# Patient Record
Sex: Female | Born: 1970
Health system: Southern US, Community
[De-identification: ages and names within clinical notes are randomized; demographics above are authoritative.]

## PROBLEM LIST (undated history)

## (undated) DIAGNOSIS — I10 Essential (primary) hypertension: Secondary | ICD-10-CM

## (undated) DIAGNOSIS — G43909 Migraine, unspecified, not intractable, without status migrainosus: Secondary | ICD-10-CM

## (undated) DIAGNOSIS — D1802 Hemangioma of intracranial structures: Secondary | ICD-10-CM

## (undated) HISTORY — PX: BREAST CYST ASPIRATION: SHX578

## (undated) HISTORY — PX: CHOLECYSTECTOMY: SHX55

## (undated) HISTORY — PX: FOOT SURGERY: SHX648

---

## 2006-08-06 ENCOUNTER — Ambulatory Visit: Payer: Self-pay

## 2007-07-05 DIAGNOSIS — J309 Allergic rhinitis, unspecified: Secondary | ICD-10-CM | POA: Insufficient documentation

## 2007-08-14 ENCOUNTER — Emergency Department: Payer: Self-pay | Admitting: Emergency Medicine

## 2007-12-09 ENCOUNTER — Ambulatory Visit: Payer: Self-pay | Admitting: Family Medicine

## 2007-12-09 DIAGNOSIS — R5381 Other malaise: Secondary | ICD-10-CM | POA: Insufficient documentation

## 2008-04-23 ENCOUNTER — Ambulatory Visit: Payer: Self-pay | Admitting: Unknown Physician Specialty

## 2008-08-10 ENCOUNTER — Ambulatory Visit: Payer: Self-pay | Admitting: Family Medicine

## 2008-09-14 DIAGNOSIS — G43109 Migraine with aura, not intractable, without status migrainosus: Secondary | ICD-10-CM | POA: Insufficient documentation

## 2008-10-01 ENCOUNTER — Other Ambulatory Visit: Payer: Self-pay | Admitting: Unknown Physician Specialty

## 2008-11-25 ENCOUNTER — Emergency Department: Payer: Self-pay | Admitting: Emergency Medicine

## 2008-11-27 ENCOUNTER — Other Ambulatory Visit: Payer: Self-pay | Admitting: Obstetrics and Gynecology

## 2009-08-21 ENCOUNTER — Ambulatory Visit: Payer: Self-pay | Admitting: Unknown Physician Specialty

## 2009-10-19 ENCOUNTER — Other Ambulatory Visit: Payer: Self-pay | Admitting: Obstetrics and Gynecology

## 2009-11-08 ENCOUNTER — Ambulatory Visit: Payer: Self-pay | Admitting: Obstetrics and Gynecology

## 2010-04-11 ENCOUNTER — Other Ambulatory Visit: Payer: Self-pay | Admitting: General Practice

## 2010-10-17 ENCOUNTER — Other Ambulatory Visit: Payer: Self-pay | Admitting: Obstetrics and Gynecology

## 2010-11-13 ENCOUNTER — Ambulatory Visit: Payer: Self-pay | Admitting: Obstetrics and Gynecology

## 2010-11-28 ENCOUNTER — Ambulatory Visit: Payer: Self-pay | Admitting: Family Medicine

## 2010-12-05 ENCOUNTER — Ambulatory Visit: Payer: Self-pay | Admitting: Podiatry

## 2010-12-08 LAB — PATHOLOGY REPORT

## 2011-12-23 ENCOUNTER — Ambulatory Visit: Payer: Self-pay | Admitting: Obstetrics and Gynecology

## 2012-01-07 ENCOUNTER — Other Ambulatory Visit: Payer: Self-pay | Admitting: Physician Assistant

## 2012-01-07 LAB — COMPREHENSIVE METABOLIC PANEL
Anion Gap: 9 (ref 7–16)
Bilirubin,Total: 0.3 mg/dL (ref 0.2–1.0)
Chloride: 102 mmol/L (ref 98–107)
Co2: 26 mmol/L (ref 21–32)
Creatinine: 0.79 mg/dL (ref 0.60–1.30)
EGFR (African American): >60
EGFR (Non-African Amer.): >60
Glucose: 87 mg/dL (ref 65–99)
Osmolality: 273 (ref 275–301)
Potassium: 3.5 mmol/L (ref 3.5–5.1)
SGOT(AST): 19 U/L (ref 15–37)
SGPT (ALT): 25 U/L (ref 12–78)
Sodium: 137 mmol/L (ref 136–145)

## 2012-01-07 LAB — CBC WITH DIFFERENTIAL/PLATELET
Basophil #: 0.2 10*3/uL — ABNORMAL HIGH (ref 0.0–0.1)
Eosinophil #: 0.3 10*3/uL (ref 0.0–0.7)
Eosinophil %: 2.5 %
HCT: 40.1 % (ref 35.0–47.0)
MCHC: 33.9 g/dL (ref 32.0–36.0)
MCV: 88 fL (ref 80–100)
Monocyte #: 1.1 x10 3/mm — ABNORMAL HIGH (ref 0.2–0.9)
Monocyte %: 8.5 %
Neutrophil #: 6.4 10*3/uL (ref 1.4–6.5)
RBC: 4.55 10*6/uL (ref 3.80–5.20)
RDW: 12.2 % (ref 11.5–14.5)
WBC: 13.5 10*3/uL — ABNORMAL HIGH (ref 3.6–11.0)

## 2012-01-07 LAB — LIPID PANEL
HDL Cholesterol: 45 mg/dL (ref 40–60)
Ldl Cholesterol, Calc: 129 mg/dL — ABNORMAL HIGH (ref 0–100)
Triglycerides: 151 mg/dL (ref 0–200)

## 2012-01-07 LAB — HEMOGLOBIN A1C: Hemoglobin A1C: 5.8 % (ref 4.2–6.3)

## 2012-10-27 ENCOUNTER — Other Ambulatory Visit: Payer: Self-pay | Admitting: Family Medicine

## 2012-10-27 LAB — COMPREHENSIVE METABOLIC PANEL
Albumin: 3.6 g/dL (ref 3.4–5.0)
Alkaline Phosphatase: 80 U/L (ref 50–136)
Anion Gap: 3 — ABNORMAL LOW (ref 7–16)
Chloride: 104 mmol/L (ref 98–107)
Co2: 32 mmol/L (ref 21–32)
EGFR (African American): >60
EGFR (Non-African Amer.): >60
Glucose: 90 mg/dL (ref 65–99)
Osmolality: 277 (ref 275–301)
Potassium: 4 mmol/L (ref 3.5–5.1)
SGPT (ALT): 26 U/L (ref 12–78)

## 2012-10-27 LAB — CBC WITH DIFFERENTIAL/PLATELET
Basophil #: 0.1 10*3/uL (ref 0.0–0.1)
Eosinophil %: 1.6 %
Lymphocyte #: 4.1 10*3/uL — ABNORMAL HIGH (ref 1.0–3.6)
MCH: 30.2 pg (ref 26.0–34.0)
MCHC: 34.4 g/dL (ref 32.0–36.0)
Monocyte #: 0.7 x10 3/mm (ref 0.2–0.9)
Monocyte %: 7.2 %
Neutrophil #: 4.2 10*3/uL (ref 1.4–6.5)
Neutrophil %: 45.6 %
RBC: 4.87 10*6/uL (ref 3.80–5.20)

## 2012-10-27 LAB — TSH: Thyroid Stimulating Horm: 1.3 u[IU]/mL

## 2012-10-27 LAB — LIPID PANEL: VLDL Cholesterol, Calc: 25 mg/dL (ref 5–40)

## 2014-01-22 ENCOUNTER — Ambulatory Visit: Payer: Self-pay | Admitting: Certified Nurse Midwife

## 2014-07-20 ENCOUNTER — Other Ambulatory Visit
Admission: RE | Admit: 2014-07-20 | Discharge: 2014-07-20 | Disposition: A | Discharge status: Home / Self Care | Payer: 59 | Source: Ambulatory Visit | Attending: Family Medicine | Admitting: Family Medicine

## 2014-07-20 DIAGNOSIS — E780 Pure hypercholesterolemia: Secondary | ICD-10-CM | POA: Insufficient documentation

## 2014-07-20 DIAGNOSIS — I10 Essential (primary) hypertension: Secondary | ICD-10-CM | POA: Insufficient documentation

## 2014-07-20 LAB — COMPREHENSIVE METABOLIC PANEL
ALT: 22 U/L (ref 14–54)
AST: 18 U/L (ref 15–41)
Albumin: 3.8 g/dL (ref 3.5–5.0)
Alkaline Phosphatase: 63 U/L (ref 38–126)
Anion gap: 7 (ref 5–15)
BUN: 15 mg/dL (ref 6–20)
CALCIUM: 8.7 mg/dL — AB (ref 8.9–10.3)
CHLORIDE: 106 mmol/L (ref 101–111)
CO2: 27 mmol/L (ref 22–32)
Creatinine, Ser: 0.88 mg/dL (ref 0.44–1.00)
GLUCOSE: 89 mg/dL (ref 65–99)
Potassium: 3.9 mmol/L (ref 3.5–5.1)
Sodium: 140 mmol/L (ref 135–145)
Total Bilirubin: 0.9 mg/dL (ref 0.3–1.2)
Total Protein: 7.7 g/dL (ref 6.5–8.1)

## 2014-07-20 LAB — LIPID PANEL
CHOLESTEROL: 268 mg/dL — AB (ref 0–200)
HDL: 59 mg/dL (ref >40)
LDL CALC: 187 mg/dL — AB (ref 0–99)
Total CHOL/HDL Ratio: 4.5 RATIO
Triglycerides: 111 mg/dL (ref <150)
VLDL: 22 mg/dL (ref 0–40)

## 2014-07-20 LAB — CBC WITH DIFFERENTIAL/PLATELET
BASOS PCT: 1 %
Basophils Absolute: 0.1 10*3/uL (ref 0–0.1)
Eosinophils Absolute: 0.2 10*3/uL (ref 0–0.7)
Eosinophils Relative: 2 %
HCT: 45.6 % (ref 35.0–47.0)
HEMOGLOBIN: 14.7 g/dL (ref 12.0–16.0)
LYMPHS PCT: 49 %
Lymphs Abs: 4 10*3/uL — ABNORMAL HIGH (ref 1.0–3.6)
MCH: 28.9 pg (ref 26.0–34.0)
MCHC: 32.3 g/dL (ref 32.0–36.0)
MCV: 89.5 fL (ref 80.0–100.0)
Monocytes Absolute: 0.5 10*3/uL (ref 0.2–0.9)
Monocytes Relative: 6 %
NEUTROS PCT: 42 %
Neutro Abs: 3.4 10*3/uL (ref 1.4–6.5)
Platelets: 261 10*3/uL (ref 150–440)
RBC: 5.09 MIL/uL (ref 3.80–5.20)
RDW: 13.2 % (ref 11.5–14.5)
WBC: 8.2 10*3/uL (ref 3.6–11.0)

## 2014-07-20 LAB — TSH: TSH: 0.599 u[IU]/mL (ref 0.350–4.500)

## 2014-08-07 ENCOUNTER — Encounter: Payer: Self-pay | Admitting: Dietician

## 2014-08-07 ENCOUNTER — Encounter: Payer: 59 | Attending: Family Medicine | Admitting: Dietician

## 2014-08-07 VITALS — Ht 64.0 in | Wt 169.9 lb

## 2014-08-07 DIAGNOSIS — E663 Overweight: Secondary | ICD-10-CM | POA: Diagnosis not present

## 2014-08-07 DIAGNOSIS — I10 Essential (primary) hypertension: Secondary | ICD-10-CM | POA: Insufficient documentation

## 2014-08-07 NOTE — Progress Notes (Signed)
Medical Nutrition Therapy: Visit start time: 0900  end time: 1000  Assessment:  Diagnosis: HTN, overweight Past medical history:  Psychosocial issues/ stress concerns: none Preferred learning method:  . Hands-on  Current weight: 169.9lbs  Height: 5'4" Medications, supplements: listed in chart Progress and evaluation: Patient reports difficulty losing weight, despite significant increase in physical exercise. She has been working to increase vegetable and fruit intake, and decrease carb intake.     She was using MyFitnessPal which advised 1200kcal daily; now has stopped. Physical activity: cardio and strength training with personal trainer 30-60 minutes, 3-4 times per week  Dietary Intake:  Usual eating pattern includes 2-3 meals and 0-1 snacks per day. Dining out frequency: 3-4 meals per week.  Breakfast: cereal or pancakes. Doesn't like eggs. Occasionally bacon Snack: none Lunch: sometimes sandwich,  Snack: pkg oatmeal or leftovers from home at 9pm at work [employer] chicken, veg, potato from cafeteria. Pork chop, corn, broccoli, canteloupe 5/30. If out, usually salad chicken wrap or fish sandwich.  Snack: chips, popcorn, occasionally nuts at work.  Beverages: drinks mostly water, occasionally sweet tea when out, once a week average. Pepsi maybe 2 times a month.   Nutrition Care Education: Topics covered: HTN, weight control Basic nutrition: basic food groups, appropriate nutrient balance, appropriate meal and snack schedule  Weight control: factors affecting metabolism and weight loss, behavioral changes for weight loss, controlling/ measuring food portions, tracking food intake, 1300kcal meal plan Hypertension: DASH diet;  identifying high sodium foods, identifying food sources of Calcium, potassium, magnesium Other lifestyle changes:  identifying habits that need to change  Nutritional Diagnosis:  [place]-3.3 Overweight/obesity As related to excessive energy intake.  As  evidenced by BMI 29.2.  Intervention: Instruction as listed above.    Established goals to further improve nutrient balance in meals and to ensure adequate nutrition for the day.    Commended pt for the changes she has made thus far.   Discussed possible reasons for difficulty with weight loss.   Education Materials given:  . General diet guidelines for Hypertension . Food lists/ Planning A Balanced Meal . Sample meal pattern/ menus . Goals/ instructions   Learner/ who was taught:  . Patient   Level of understanding: Marland Kitchen Verbalizes/ demonstrates competency  Demonstrated degree of understanding via:   Teach back Learning barriers: . None  Willingness to learn/ readiness for change: . Eager, change in progress   Monitoring and Evaluation:  Dietary intake, exercise, and body weight. Follow-up 09/14/14

## 2014-08-07 NOTE — Patient Instructions (Signed)
Plan meals ahead of time to achieve a balance of large vegetable and fruit portions, with controlled starch and meat/protein portions.  Eat something every 4-5 hours while awake.  Consider some form of tracking food intake, whether online/ phone app or keeping paper food diary, or marking number of food servings using hashmarks.

## 2014-09-14 ENCOUNTER — Ambulatory Visit: Payer: 59 | Admitting: Dietician

## 2014-10-30 ENCOUNTER — Ambulatory Visit
Admission: RE | Admit: 2014-10-30 | Discharge: 2014-10-30 | Disposition: A | Discharge status: Home / Self Care | Payer: 59 | Source: Ambulatory Visit | Attending: Physician Assistant | Admitting: Physician Assistant

## 2014-10-30 ENCOUNTER — Other Ambulatory Visit: Payer: Self-pay | Admitting: Physician Assistant

## 2014-10-30 DIAGNOSIS — N200 Calculus of kidney: Secondary | ICD-10-CM | POA: Diagnosis not present

## 2014-10-30 DIAGNOSIS — R319 Hematuria, unspecified: Secondary | ICD-10-CM

## 2014-10-30 DIAGNOSIS — Z9049 Acquired absence of other specified parts of digestive tract: Secondary | ICD-10-CM | POA: Insufficient documentation

## 2014-10-30 DIAGNOSIS — R52 Pain, unspecified: Secondary | ICD-10-CM

## 2014-10-30 DIAGNOSIS — R109 Unspecified abdominal pain: Secondary | ICD-10-CM | POA: Diagnosis present

## 2014-10-31 ENCOUNTER — Encounter: Payer: Self-pay | Admitting: Dietician

## 2015-01-04 ENCOUNTER — Other Ambulatory Visit: Payer: Self-pay | Admitting: Nurse Practitioner

## 2015-02-21 ENCOUNTER — Other Ambulatory Visit: Payer: Self-pay | Admitting: Nurse Practitioner

## 2015-02-21 DIAGNOSIS — Z1231 Encounter for screening mammogram for malignant neoplasm of breast: Secondary | ICD-10-CM

## 2015-02-28 ENCOUNTER — Ambulatory Visit
Admission: RE | Admit: 2015-02-28 | Discharge: 2015-02-28 | Disposition: A | Discharge status: Home / Self Care | Payer: 59 | Source: Ambulatory Visit | Attending: Nurse Practitioner | Admitting: Nurse Practitioner

## 2015-02-28 ENCOUNTER — Other Ambulatory Visit: Payer: Self-pay | Admitting: Nurse Practitioner

## 2015-02-28 DIAGNOSIS — Z1231 Encounter for screening mammogram for malignant neoplasm of breast: Secondary | ICD-10-CM

## 2015-04-05 DIAGNOSIS — J301 Allergic rhinitis due to pollen: Secondary | ICD-10-CM | POA: Diagnosis not present

## 2015-04-08 DIAGNOSIS — J301 Allergic rhinitis due to pollen: Secondary | ICD-10-CM | POA: Diagnosis not present

## 2015-06-20 DIAGNOSIS — M7752 Other enthesopathy of left foot: Secondary | ICD-10-CM | POA: Diagnosis not present

## 2015-06-20 DIAGNOSIS — J019 Acute sinusitis, unspecified: Secondary | ICD-10-CM | POA: Insufficient documentation

## 2015-06-20 DIAGNOSIS — M79672 Pain in left foot: Secondary | ICD-10-CM | POA: Diagnosis not present

## 2015-07-12 DIAGNOSIS — J301 Allergic rhinitis due to pollen: Secondary | ICD-10-CM | POA: Diagnosis not present

## 2015-07-25 DIAGNOSIS — J301 Allergic rhinitis due to pollen: Secondary | ICD-10-CM | POA: Diagnosis not present

## 2015-10-09 DIAGNOSIS — J301 Allergic rhinitis due to pollen: Secondary | ICD-10-CM | POA: Diagnosis not present

## 2015-10-24 DIAGNOSIS — J309 Allergic rhinitis, unspecified: Secondary | ICD-10-CM | POA: Diagnosis not present

## 2015-10-25 ENCOUNTER — Ambulatory Visit (INDEPENDENT_AMBULATORY_CARE_PROVIDER_SITE_OTHER): Payer: 59 | Admitting: Physician Assistant

## 2015-10-25 ENCOUNTER — Encounter: Payer: Self-pay | Admitting: Physician Assistant

## 2015-10-25 VITALS — BP 138/92 | HR 68 | Temp 98.3°F | Resp 16 | Wt 172.8 lb

## 2015-10-25 DIAGNOSIS — E7800 Pure hypercholesterolemia, unspecified: Secondary | ICD-10-CM | POA: Diagnosis not present

## 2015-10-25 DIAGNOSIS — I10 Essential (primary) hypertension: Secondary | ICD-10-CM | POA: Diagnosis not present

## 2015-10-25 DIAGNOSIS — R5383 Other fatigue: Secondary | ICD-10-CM | POA: Insufficient documentation

## 2015-10-25 DIAGNOSIS — Z833 Family history of diabetes mellitus: Secondary | ICD-10-CM | POA: Diagnosis not present

## 2015-10-25 MED ORDER — TRIAMTERENE-HCTZ 37.5-25 MG PO TABS: 1.0000 | ORAL_TABLET | Freq: Every day | ORAL | 3 refills | Status: DC

## 2015-10-25 NOTE — Progress Notes (Signed)
   Patient: Ashley [name] Female    DOB: 1971-01-10   45 y.o.   MRN: [id number] Visit Date: 10/25/2015  Today's Provider: Mar Daring, PA-C   Chief Complaint  Patient presents with  . Hypertension  . Follow-up   Subjective:    HPI  Hypertension, follow-up:  BP Readings from Last 3 Encounters:  10/25/15 (!) 138/92    She was last seen for hypertension 07/18/2014  BP at that visit was 146/98. Management changes since that visit include continue Maxzide. She reports good compliance with treatment. She is not having side effects.  She is exercising. She is adherent to low salt diet.   Outside blood pressures are being checked sometimes. She is experiencing none.  Patient denies none.   Cardiovascular risk factors include dyslipidemia.  Use of agents associated with hypertension: none.     Weight trend: stable Wt Readings from Last 3 Encounters:  10/25/15 172 lb 12.8 oz (78.4 kg)  08/07/14 169 lb 14.4 oz (77.1 kg)    ------------------------------------------------------------------------    Previous Medications   NORETHINDRONE-ETHINYL ESTRADIOL (JUNEL FE,GILDESS FE,LOESTRIN FE) 1-20 MG-MCG TABLET    Take 1 tablet by mouth daily.   TRIAMTERENE-HYDROCHLOROTHIAZIDE (MAXZIDE-25) 37.5-25 MG PER TABLET    Take 1 tablet by mouth daily.    Review of Systems  Constitutional: Negative.   Respiratory: Negative.   Cardiovascular: Negative.   Gastrointestinal: Negative.   Endocrine: Negative.   Neurological: Negative.     Social History  Substance Use Topics  . Smoking status: Never Smoker  . Smokeless tobacco: Never Used  . Alcohol use No   Objective:   BP (!) 138/92 (BP Location: Right Arm, Patient Position: Sitting, Cuff Size: Normal)   Pulse 68   Temp 98.3 F (36.8 C) (Oral)   Resp 16   Wt 172 lb 12.8 oz (78.4 kg)   BMI 29.66 kg/m   Physical Exam  Constitutional: She appears well-developed and well-nourished. No distress.  Neck: Normal range of  motion. Neck supple. No JVD present. No tracheal deviation present. No thyromegaly present.  Cardiovascular: Normal rate, regular rhythm and normal heart sounds.  Exam reveals no gallop and no friction rub.   No murmur heard. Pulmonary/Chest: Effort normal and breath sounds normal. No respiratory distress. She has no wheezes. She has no rales.  Musculoskeletal: She exhibits no edema.  Lymphadenopathy:    She has no cervical adenopathy.  Skin: She is not diaphoretic.  Vitals reviewed.     Assessment & Plan:     1. Hypercholesterolemia Will check labs as below and f/u pending results. - Lipid Profile  2. Essential hypertension Stable. Diagnosis pulled for medication refill. Continue current medical treatment plan. Will check labs as below and f/u pending results. - triamterene-hydrochlorothiazide (MAXZIDE-25) 37.5-25 MG tablet; Take 1 tablet by mouth daily.  Dispense: 90 tablet; Refill: 3 - CBC with Differential - Comprehensive Metabolic Panel (CMET) - TSH  3. Family history of diabetes mellitus (DM) Will check labs as below and f/u pending results. - HgB A1c  The entirety of the information documented in the History of Present Illness, Review of Systems and Physical Exam were personally obtained by me. Portions of this information were initially documented by Charolett Bumpers, CMA and reviewed by me for thoroughness and accuracy.  Follow up: No Follow-up on file.

## 2015-10-25 NOTE — Patient Instructions (Signed)
Hypertension Hypertension, commonly called high blood pressure, is when the force of blood pumping through your arteries is too strong. Your arteries are the blood vessels that carry blood from your heart throughout your body. A blood pressure reading consists of a higher number over a lower number, such as 110/72. The higher number (systolic) is the pressure inside your arteries when your heart pumps. The lower number (diastolic) is the pressure inside your arteries when your heart relaxes. Ideally you want your blood pressure below 120/80. Hypertension forces your heart to work harder to pump blood. Your arteries may become narrow or stiff. Having untreated or uncontrolled hypertension can cause heart attack, stroke, kidney disease, and other problems. RISK FACTORS Some risk factors for high blood pressure are controllable. Others are not.  Risk factors you cannot control include:   Race. You may be at higher risk if you are African American.  Age. Risk increases with age.  Gender. Men are at higher risk than women before age 45 years. After age 65, women are at higher risk than men. Risk factors you can control include:  Not getting enough exercise or physical activity.  Being overweight.  Getting too much fat, sugar, calories, or salt in your diet.  Drinking too much alcohol. SIGNS AND SYMPTOMS Hypertension does not usually cause signs or symptoms. Extremely high blood pressure (hypertensive crisis) may cause headache, anxiety, shortness of breath, and nosebleed. DIAGNOSIS To check if you have hypertension, your health care provider will measure your blood pressure while you are seated, with your arm held at the level of your heart. It should be measured at least twice using the same arm. Certain conditions can cause a difference in blood pressure between your right and left arms. A blood pressure reading that is higher than normal on one occasion does not mean that you need treatment. If  it is not clear whether you have high blood pressure, you may be asked to return on a different day to have your blood pressure checked again. Or, you may be asked to monitor your blood pressure at home for 1 or more weeks. TREATMENT Treating high blood pressure includes making lifestyle changes and possibly taking medicine. Living a healthy lifestyle can help lower high blood pressure. You may need to change some of your habits. Lifestyle changes may include:  Following the DASH diet. This diet is high in fruits, vegetables, and whole grains. It is low in salt, red meat, and added sugars.  Keep your sodium intake below 2,300 mg per day.  Getting at least 30-45 minutes of aerobic exercise at least 4 times per week.  Losing weight if necessary.  Not smoking.  Limiting alcoholic beverages.  Learning ways to reduce stress. Your health care provider may prescribe medicine if lifestyle changes are not enough to get your blood pressure under control, and if one of the following is true:  You are 18-59 years of age and your systolic blood pressure is above 140.  You are 60 years of age or older, and your systolic blood pressure is above 150.  Your diastolic blood pressure is above 90.  You have diabetes, and your systolic blood pressure is over 140 or your diastolic blood pressure is over 90.  You have kidney disease and your blood pressure is above 140/90.  You have heart disease and your blood pressure is above 140/90. Your personal target blood pressure may vary depending on your medical conditions, your age, and other factors. HOME CARE INSTRUCTIONS    Have your blood pressure rechecked as directed by your health care provider.   Take medicines only as directed by your health care provider. Follow the directions carefully. Blood pressure medicines must be taken as prescribed. The medicine does not work as well when you skip doses. Skipping doses also puts you at risk for  problems.  Do not smoke.   Monitor your blood pressure at home as directed by your health care provider. SEEK MEDICAL CARE IF:   You think you are having a reaction to medicines taken.  You have recurrent headaches or feel dizzy.  You have swelling in your ankles.  You have trouble with your vision. SEEK IMMEDIATE MEDICAL CARE IF:  You develop a severe headache or confusion.  You have unusual weakness, numbness, or feel faint.  You have severe chest or abdominal pain.  You vomit repeatedly.  You have trouble breathing. MAKE SURE YOU:   Understand these instructions.  Will watch your condition.  Will get help right away if you are not doing well or get worse.   This information is not intended to replace advice given to you by your health care provider. Make sure you discuss any questions you have with your health care provider.   Document Released: 02/23/2005 Document Revised: 07/10/2014 Document Reviewed: 12/16/2012 Elsevier Interactive Patient Education 2016 [employer]  

## 2015-10-31 DIAGNOSIS — J301 Allergic rhinitis due to pollen: Secondary | ICD-10-CM | POA: Diagnosis not present

## 2016-01-03 ENCOUNTER — Ambulatory Visit: Payer: Self-pay | Admitting: Physician Assistant

## 2016-01-03 ENCOUNTER — Encounter: Payer: Self-pay | Admitting: Physician Assistant

## 2016-01-03 VITALS — BP 134/90 | HR 60 | Temp 98.4°F

## 2016-01-03 DIAGNOSIS — L0291 Cutaneous abscess, unspecified: Secondary | ICD-10-CM

## 2016-01-03 MED ORDER — SULFAMETHOXAZOLE-TRIMETHOPRIM 800-160 MG PO TABS: 1.0000 | ORAL_TABLET | Freq: Two times a day (BID) | ORAL | 0 refills | Status: DC

## 2016-01-03 NOTE — Progress Notes (Signed)
S: c/o red swollen bump on left buttock, area was worse yesterday, soaked in warm water last night which helped, worried as she has to run in a 5k and thinks it will be aggravated  O: vitals wnl, nad, skin with hard indurated area size of quarter, no fluctuance noted, no drainage noted, n/v intact  A: abscess  P: warm water soaks, if worsening use septra ds 1 po bid x 7d

## 2016-02-14 DIAGNOSIS — Z124 Encounter for screening for malignant neoplasm of cervix: Secondary | ICD-10-CM | POA: Diagnosis not present

## 2016-02-14 DIAGNOSIS — E669 Obesity, unspecified: Secondary | ICD-10-CM | POA: Diagnosis not present

## 2016-02-14 DIAGNOSIS — Z0000 Encounter for general adult medical examination without abnormal findings: Secondary | ICD-10-CM | POA: Diagnosis not present

## 2016-02-14 DIAGNOSIS — Z01419 Encounter for gynecological examination (general) (routine) without abnormal findings: Secondary | ICD-10-CM | POA: Diagnosis not present

## 2016-02-14 LAB — HM PAP SMEAR: HM PAP: NEGATIVE

## 2016-02-17 ENCOUNTER — Other Ambulatory Visit: Payer: Self-pay | Admitting: Nurse Practitioner

## 2016-02-17 ENCOUNTER — Other Ambulatory Visit: Payer: Self-pay | Admitting: Unknown Physician Specialty

## 2016-02-17 ENCOUNTER — Other Ambulatory Visit: Payer: Self-pay | Admitting: Advanced Practice Midwife

## 2016-02-17 DIAGNOSIS — Z1231 Encounter for screening mammogram for malignant neoplasm of breast: Secondary | ICD-10-CM

## 2016-03-27 ENCOUNTER — Ambulatory Visit
Admission: RE | Admit: 2016-03-27 | Discharge: 2016-03-27 | Disposition: A | Discharge status: Home / Self Care | Payer: 59 | Source: Ambulatory Visit | Attending: Advanced Practice Midwife | Admitting: Advanced Practice Midwife

## 2016-03-27 DIAGNOSIS — Z1231 Encounter for screening mammogram for malignant neoplasm of breast: Secondary | ICD-10-CM | POA: Insufficient documentation

## 2016-04-17 DIAGNOSIS — M7752 Other enthesopathy of left foot: Secondary | ICD-10-CM | POA: Diagnosis not present

## 2016-07-06 ENCOUNTER — Telehealth: Payer: Self-pay | Admitting: Physician Assistant

## 2016-07-06 MED ORDER — ALBUTEROL SULFATE HFA 108 (90 BASE) MCG/ACT IN AERS: 2.0000 | INHALATION_SPRAY | Freq: Four times a day (QID) | RESPIRATORY_TRACT | 3 refills | Status: DC | PRN

## 2016-07-06 NOTE — Telephone Encounter (Signed)
Pt states she is getting tight in chest while running, ?if needs inhaler, will come to clinic on Friday for exam, sent rx for inhaler to pharmacy

## 2016-07-10 ENCOUNTER — Encounter: Payer: Self-pay | Admitting: Physician Assistant

## 2016-07-10 ENCOUNTER — Ambulatory Visit: Payer: Self-pay | Admitting: Physician Assistant

## 2016-07-10 VITALS — BP 120/80 | HR 84 | Temp 98.9°F

## 2016-07-10 DIAGNOSIS — J301 Allergic rhinitis due to pollen: Secondary | ICD-10-CM

## 2016-07-10 MED ORDER — MONTELUKAST SODIUM 10 MG PO TABS: 10.0000 mg | ORAL_TABLET | Freq: Every day | ORAL | 3 refills | Status: DC

## 2016-07-10 NOTE — Progress Notes (Signed)
S: c/o runny nose, congestion, watery eyes, some sinus pressure, sx for about a week, denies fever/chills/body aches, cough, cp/sob, or v/d, states she feels tight when she's running but the inhaler we gave her the other day helped, states the allergy sx start as soon as she opens the door to go outside, taking an otc allergy med  O: vitals wnl, nad, perrl eomi, conjunctiva wnl, tms dull, nasal mucosa swollen and boggy, throat wnl, neck supple no lymph, lungs c t a, cv rrr  A: acute seasonal allergies  P: saline nasal rinse, singulair, continue otc allergy meds, inhaler prn

## 2016-08-14 ENCOUNTER — Ambulatory Visit: Payer: Self-pay | Admitting: Physician Assistant

## 2016-11-18 ENCOUNTER — Telehealth: Payer: Self-pay | Admitting: Physician Assistant

## 2016-11-18 DIAGNOSIS — I10 Essential (primary) hypertension: Secondary | ICD-10-CM

## 2016-11-18 MED ORDER — TRIAMTERENE-HCTZ 37.5-25 MG PO TABS: 1.0000 | ORAL_TABLET | Freq: Every day | ORAL | 3 refills | Status: DC

## 2016-11-18 NOTE — Telephone Encounter (Signed)
Pt needs refill on her triamterene maxzide 25  She uses [facility]  Thanks teri

## 2016-11-27 DIAGNOSIS — H5213 Myopia, bilateral: Secondary | ICD-10-CM | POA: Diagnosis not present

## 2016-12-14 ENCOUNTER — Encounter: Payer: Self-pay | Admitting: Physician Assistant

## 2016-12-14 ENCOUNTER — Ambulatory Visit: Payer: 59 | Admitting: Physician Assistant

## 2016-12-14 ENCOUNTER — Ambulatory Visit (INDEPENDENT_AMBULATORY_CARE_PROVIDER_SITE_OTHER): Payer: 59 | Admitting: Physician Assistant

## 2016-12-14 VITALS — BP 140/90 | HR 98 | Temp 98.3°F | Resp 16 | Wt 169.0 lb

## 2016-12-14 DIAGNOSIS — S060X0A Concussion without loss of consciousness, initial encounter: Secondary | ICD-10-CM | POA: Diagnosis not present

## 2016-12-14 DIAGNOSIS — F0781 Postconcussional syndrome: Secondary | ICD-10-CM

## 2016-12-14 NOTE — Progress Notes (Signed)
Patient: Ashley [name] Female    DOB: [birth year]   46 y.o.   MRN: [id number] Visit Date: 12/14/2016  Today's Provider: Mar Daring, PA-C   Chief Complaint  Patient presents with  . Head Injury   Subjective:    HPI  Patient is here today with c/o hit head on door frame of car on Saturday morning on her way to the [employer] 5K. She reports she didn't pass out. She reports she ran across the street and realized she couldn't do the 5k so she told them she was coming back to her house. They had someone to follow her from [place] to her house to make sure she made it safe. She is having a headache, lightheadedness and feels nauseous.She reports that today when she woke up she had some pressure and pain behind her right eye, with some tinnitus and now ear pain. She also reports that she was having some hard time focusing this morning. No redness,swelling or vomiting.  Patient had her Influenza Vaccine work ([employer] employee) 11/17/2016-copy of yellow card made.    Allergies  Allergen Reactions  . Ciprofloxacin Hives  . Codeine Nausea And Vomiting  . Gabapentin Nausea And Vomiting  . Hydrocodone-Acetaminophen Nausea And Vomiting  . Hydromorphone Hcl Nausea And Vomiting  . Oxycodone-Acetaminophen Nausea And Vomiting  . Promethazine Hcl Nausea And Vomiting  . Tramadol Nausea And Vomiting     Current Outpatient Prescriptions:  .  albuterol (PROVENTIL HFA;VENTOLIN HFA) 108 (90 Base) MCG/ACT inhaler, Inhale 2 puffs into the lungs every 6 (six) hours as needed., Disp: 1 Inhaler, Rfl: 3 .  norethindrone-ethinyl estradiol (JUNEL FE,GILDESS FE,LOESTRIN FE) 1-20 MG-MCG tablet, Take 1 tablet by mouth daily., Disp: , Rfl:  .  triamterene-hydrochlorothiazide (MAXZIDE-25) 37.5-25 MG tablet, Take 1 tablet by mouth daily., Disp: 90 tablet, Rfl: 3 .  montelukast (SINGULAIR) 10 MG tablet, Take 1 tablet (10 mg total) by mouth at bedtime. (Patient not taking: Reported on  12/14/2016), Disp: 30 tablet, Rfl: 3 .  sulfamethoxazole-trimethoprim (BACTRIM DS,SEPTRA DS) 800-160 MG tablet, Take 1 tablet by mouth 2 (two) times daily. (Patient not taking: Reported on 07/10/2016), Disp: 14 tablet, Rfl: 0  Review of Systems  Constitutional: Positive for fatigue. Negative for activity change, appetite change, chills, diaphoresis, fever and unexpected weight change.  HENT: Positive for ear pain and tinnitus. Negative for congestion and hearing loss.   Eyes: Positive for visual disturbance.  Respiratory: Negative.   Cardiovascular: Negative for chest pain, palpitations and leg swelling.  Gastrointestinal: Positive for nausea. Negative for abdominal pain and vomiting.  Neurological: Positive for headaches. Negative for dizziness, weakness, light-headedness and numbness.  Psychiatric/Behavioral: Negative.     Social History  Substance Use Topics  . Smoking status: Never Smoker  . Smokeless tobacco: Never Used  . Alcohol use No   Objective:   BP 140/90 (BP Location: Left Arm, Patient Position: Sitting, Cuff Size: Normal)   Pulse 98   Temp 98.3 F (36.8 C) (Oral)   Resp 16   Wt 169 lb (76.7 kg)   BMI 29.01 kg/m    Physical Exam  Constitutional: She is oriented to person, place, and time. She appears well-developed and well-nourished. No distress.  HENT:  Head: Normocephalic and atraumatic.  Right Ear: Hearing, tympanic membrane, external ear and ear canal normal.  Left Ear: Hearing, tympanic membrane, external ear and ear canal normal.  Nose: Nose normal.  Mouth/Throat: Oropharynx is clear and moist. No  oropharyngeal exudate.  Eyes: Pupils are equal, round, and reactive to light. Conjunctivae and EOM are normal. Right eye exhibits no nystagmus. Left eye exhibits no nystagmus.  Neck: Normal range of motion. Neck supple. Muscular tenderness present. No tracheal deviation present. No thyromegaly present.  Cardiovascular: Normal rate, regular rhythm and normal heart  sounds.  Exam reveals no gallop and no friction rub.   No murmur heard. Pulmonary/Chest: Effort normal and breath sounds normal. No respiratory distress. She has no wheezes. She has no rales.  Lymphadenopathy:    She has no cervical adenopathy.  Neurological: She is alert and oriented to person, place, and time. She has normal strength. No cranial nerve deficit or sensory deficit. She displays a negative Romberg sign. Coordination and gait normal.  Skin: She is not diaphoretic.  Psychiatric: She has a normal mood and affect. Her behavior is normal. Judgment and thought content normal.  Vitals reviewed.      Assessment & Plan:     1. Concussion without loss of consciousness, initial encounter Most likely concussion from hitting head on car door frame on Saturday while running to get in the car from the rain. No LOC. Had some dizziness, nausea and headache on Saturday initially following injury. Since has had nausea, no vomiting, persistent headache, tinnitus in the right ear, and pain/pressure/headache behind the right eye. She denies any visual changes (no blurred vision, no double vision, no visual loss, no floaters). Advised patient to try to get as much brain rest as possible. No exercise, or light exercise this week. She has f/u for CPE on Friday 12/18/16. I will follow up with her then. Strict return precautions or reasons to go to ER were explained to patient. If headaches persist or symptoms worsen may order CT head.   2. Postconcussive syndrome See above medical treatment plan.       Mar Daring, PA-C  [facility]

## 2016-12-14 NOTE — Patient Instructions (Signed)
Concussion, Adult A concussion is a brain injury from a direct hit (blow) to the head or body. This blow causes the brain to shake quickly back and forth inside the skull. This can damage brain cells and cause chemical changes in the brain. A concussion may also be known as a mild traumatic brain injury (TBI). Concussions are usually not life-threatening, but the effects of a concussion can be serious. If you have a concussion, you are more likely to experience concussion-like symptoms after a direct blow to the head in the future. What are the causes? This condition is caused by:  A direct blow to the head, such as from running into another player during a game, being hit in a fight, or hitting your head on a hard surface.  A jolt of the head or neck that causes the brain to move back and forth inside the skull, such as in a car crash.  What are the signs or symptoms? The signs of a concussion can be hard to notice. Early on, they may be missed by you, family members, and health care providers. You may look fine but act or feel differently. Symptoms are usually temporary, but they may last for days, weeks, or even longer. Some symptoms may appear right away but other symptoms may not show up for hours or days. Every head injury is different. Symptoms may include:  Headaches. This can include a feeling of pressure in the head.  Memory problems.  Trouble concentrating, organizing, or making decisions.  Slowness in thinking, acting or reacting, speaking, or reading.  Confusion.  Fatigue.  Changes in eating or sleeping patterns.  Problems with coordination or balance.  Nausea or vomiting.  Numbness or tingling.  Sensitivity to light or noise.  Vision or hearing problems.  Reduced sense of smell.  Irritability or mood changes.  Dizziness.  Lack of motivation.  Seeing or hearing things that other people do not see or hear (hallucinations).  How is this diagnosed? This  condition is diagnosed based on:  Your symptoms.  A description of your injury.  You may also have tests, including:  Imaging tests, such as a CT scan or MRI. These are done to look for signs of brain injury.  Neuropsychological tests. These measure your thinking, understanding, learning, and remembering abilities.  How is this treated? This condition is treated with physical and mental rest and careful observation, usually at home. If the concussion is severe, you may need to stay home from work for a while. You may be referred to a concussion clinic or to other health care providers for management. It is important that you tell your health care provider if:  You are taking any medicines, including prescription medicines, over-the-counter medicines, and natural remedies. Some medicines, such as blood thinners (anticoagulants) and aspirin, may increase the chance of complications, such as bleeding.  You are taking or have taken alcohol or illegal drugs. Alcohol and certain other drugs may slow your recovery and can put you at risk of further injury.  How fast you will recover from a concussion depends on many factors, such as how severe your concussion is, what part of your brain was injured, how old you are, and how healthy you were before the concussion. Recovery can take time. It is important to wait to return to activity until a health care provider says it is safe to do that and your symptoms are completely gone. Follow these instructions at home: Activity  Limit activities that  require a lot of thought or concentration. These may include: ? Doing homework or job-related work. ? Watching TV. ? Working on the computer. ? Playing memory games and puzzles.  Rest. Rest helps the brain to heal. Make sure you: ? Get plenty of sleep at night. Avoid staying up late at night. ? Keep the same bedtime hours on weekends and weekdays. ? Rest during the day. Take naps or rest breaks when you  feel tired.  Having another concussion before the first one has healed can be dangerous. Do not do high-risk activities that could cause a second concussion, such as riding a bicycle or playing sports.  Ask your health care provider when you can return to your normal activities, such as school, work, athletics, driving, riding a bicycle, or using heavy machinery. Your ability to react may be slower after a brain injury. Never do these activities if you are dizzy. Your health care provider will likely give you a plan for gradually returning to activities. General instructions  Take over-the-counter and prescription medicines only as told by your health care provider.  Do not drink alcohol until your health care provider says you can.  If it is harder than usual to remember things, write them down.  If you are easily distracted, try to do one thing at a time. For example, do not try to watch TV while fixing dinner.  Talk with family members or close friends when making important decisions.  Watch your symptoms and tell others to do the same. Complications sometimes occur after a concussion. Older adults with a brain injury may have a higher risk of serious complications, such as a blood clot in the brain.  Tell your teachers, school nurse, school counselor, coach, athletic trainer, or work Freight forwarder about your injury, symptoms, and restrictions. Tell them about what you can or cannot do. They should watch for: ? Increased problems with attention or concentration. ? Increased difficulty remembering or learning new information. ? Increased time needed to complete tasks or assignments. ? Increased irritability or decreased ability to cope with stress. ? Increased symptoms.  Keep all follow-up visits as told by your health care provider. This is important. How is this prevented? It is very important to avoid another brain injury, especially as you recover. In rare cases, another injury can lead  to permanent brain damage, brain swelling, or death. The risk of this is greatest during the first 7-10 days after a head injury. Avoid injuries by:  Wearing a seat belt when riding in a car.  Wearing a helmet when biking, skiing, skateboarding, skating, or doing similar activities.  Avoiding activities that could lead to a second concussion, such as contact or recreational sports, until your health care provider says it is okay.  Taking safety measures in your home, such as: ? Removing clutter and tripping hazards from floors and stairways. ? Using grab bars in bathrooms and handrails by stairs. ? Placing non-slip mats on floors and in bathtubs. ? Improving lighting in dim areas.  Contact a health care provider if:  Your symptoms get worse.  You have new symptoms.  You continue to have symptoms for more than 2 weeks. Get help right away if:  You have severe or worsening headaches.  You have weakness or numbness in any part of your body.  Your coordination gets worse.  You vomit repeatedly.  You are sleepier.  The pupil of one eye is larger than the other.  You have convulsions or a  seizure.  Your speech is slurred.  Your fatigue, confusion, or irritability gets worse.  You cannot recognize people or places.  You have neck pain.  It is difficult to wake you up.  You have unusual behavior changes.  You lose consciousness. Summary  A concussion is a brain injury from a direct hit (blow) to the head or body.  A concussion may also be called a mild traumatic brain injury (TBI).  You may have imaging tests and neuropsychological tests to diagnose a concussion.  This condition is treated with physical and mental rest and careful observation.  Ask your health care provider when you can return to your normal activities, such as school, work, athletics, driving, riding a bicycle, or using heavy machinery. Follow safety instructions as told by your health care  provider. This information is not intended to replace advice given to you by your health care provider. Make sure you discuss any questions you have with your health care provider. Document Released: 05/16/2003 Document Revised: 02/04/2016 Document Reviewed: 02/04/2016 Elsevier Interactive Patient Education  2017 [employer].

## 2016-12-15 ENCOUNTER — Telehealth: Payer: Self-pay

## 2016-12-15 MED ORDER — ONDANSETRON 8 MG PO TBDP
8.0000 mg | ORAL_TABLET | Freq: Three times a day (TID) | ORAL | 0 refills | Status: DC | PRN
Start: 2016-12-15 — End: 2018-04-22

## 2016-12-15 NOTE — Telephone Encounter (Signed)
Called into [facility] pharmacy. sd

## 2016-12-15 NOTE — Telephone Encounter (Signed)
Patient requesting nausea medication. Patient reports she at work and started feeling nauseated and vomitted twice. Patient reports she was sent home from work. Patient uses [facility].   Patient was seen yesterday on 12/14/16 by [place] for a concussion. sd

## 2016-12-15 NOTE — Telephone Encounter (Signed)
Pt was already [employer], RMA

## 2016-12-15 NOTE — Telephone Encounter (Signed)
Zofran 38m q 8 hours prn,#10,no rf. Call or to ED if worsens.

## 2016-12-16 ENCOUNTER — Ambulatory Visit
Admission: RE | Admit: 2016-12-16 | Discharge: 2016-12-16 | Disposition: A | Discharge status: Home / Self Care | Payer: 59 | Source: Ambulatory Visit | Attending: Physician Assistant | Admitting: Physician Assistant

## 2016-12-16 ENCOUNTER — Encounter: Payer: Self-pay | Admitting: Physician Assistant

## 2016-12-16 DIAGNOSIS — S060X0A Concussion without loss of consciousness, initial encounter: Secondary | ICD-10-CM | POA: Insufficient documentation

## 2016-12-16 DIAGNOSIS — W228XXA Striking against or struck by other objects, initial encounter: Secondary | ICD-10-CM | POA: Diagnosis not present

## 2016-12-16 DIAGNOSIS — F0781 Postconcussional syndrome: Secondary | ICD-10-CM

## 2016-12-16 DIAGNOSIS — R51 Headache: Secondary | ICD-10-CM | POA: Diagnosis not present

## 2016-12-18 ENCOUNTER — Telehealth: Payer: Self-pay

## 2016-12-18 ENCOUNTER — Encounter: Payer: Self-pay | Admitting: Physician Assistant

## 2016-12-18 ENCOUNTER — Ambulatory Visit (INDEPENDENT_AMBULATORY_CARE_PROVIDER_SITE_OTHER): Payer: 59 | Admitting: Physician Assistant

## 2016-12-18 VITALS — BP 132/84 | HR 60 | Temp 98.2°F | Resp 16 | Ht 64.0 in | Wt 165.0 lb

## 2016-12-18 DIAGNOSIS — Z833 Family history of diabetes mellitus: Secondary | ICD-10-CM

## 2016-12-18 DIAGNOSIS — F0781 Postconcussional syndrome: Secondary | ICD-10-CM | POA: Diagnosis not present

## 2016-12-18 DIAGNOSIS — I10 Essential (primary) hypertension: Secondary | ICD-10-CM

## 2016-12-18 DIAGNOSIS — D329 Benign neoplasm of meninges, unspecified: Secondary | ICD-10-CM

## 2016-12-18 DIAGNOSIS — M62838 Other muscle spasm: Secondary | ICD-10-CM | POA: Diagnosis not present

## 2016-12-18 DIAGNOSIS — Z1231 Encounter for screening mammogram for malignant neoplasm of breast: Secondary | ICD-10-CM

## 2016-12-18 DIAGNOSIS — Z1239 Encounter for other screening for malignant neoplasm of breast: Secondary | ICD-10-CM

## 2016-12-18 DIAGNOSIS — Z0000 Encounter for general adult medical examination without abnormal findings: Secondary | ICD-10-CM

## 2016-12-18 DIAGNOSIS — E7800 Pure hypercholesterolemia, unspecified: Secondary | ICD-10-CM

## 2016-12-18 DIAGNOSIS — Z124 Encounter for screening for malignant neoplasm of cervix: Secondary | ICD-10-CM | POA: Diagnosis not present

## 2016-12-18 MED ORDER — CYCLOBENZAPRINE HCL 5 MG PO TABS: 2.5000 mg | ORAL_TABLET | Freq: Three times a day (TID) | ORAL | 0 refills | Status: DC | PRN

## 2016-12-18 NOTE — Telephone Encounter (Signed)
Patient advised. sd

## 2016-12-18 NOTE — Progress Notes (Signed)
Patient: Ashley [name], Female    DOB: [birth year], 46 y.o.   MRN: [id number] Visit Date: 12/18/2016  Today's Provider: Mar Daring, PA-C   Chief Complaint  Patient presents with  . Annual Exam  . Follow-up   Subjective:    Annual physical exam Ashley [name] is a 46 y.o. female who presents today for health maintenance and complete physical. She feels fairly well. She reports exercising daily. She reports she is sleeping fairly well. -----------------------------------------------------------------  Follow up for concussion  The patient was last seen for this 4 days ago. Changes made at last visit include CT Scan.  She reports excellent compliance with treatment. Patient requested Zofran. She feels that condition is Improved. Patient reports persistent headache, and ear pain. Patient denies nausea or vomiting. She is not having side effects.  She did have a CT head which was unremarkable but did have incidental finding of left meningioma measuring 0.7cm x 04 cm.  ------------------------------------------------------------------------------------  Review of Systems  Constitutional: Negative.   HENT: Positive for ear pain.   Eyes: Negative.   Respiratory: Negative.   Cardiovascular: Negative.   Gastrointestinal: Negative.   Endocrine: Negative.   Genitourinary: Negative.   Musculoskeletal: Positive for neck pain.  Skin: Positive for color change.  Allergic/Immunologic: Negative.   Neurological: Positive for headaches.  Hematological: Negative.   Psychiatric/Behavioral: Negative.     Social History      She  reports that she has never smoked. She has never used smokeless tobacco. She reports that she does not drink alcohol.       Social History   Social History  . Marital status: Single    Spouse name: N/A  . Number of children: N/A  . Years of education: N/A   Social History Main Topics  . Smoking status: Never Smoker  . Smokeless tobacco:  Never Used  . Alcohol use No  . Drug use: Unknown  . Sexual activity: Not Asked   Other Topics Concern  . None   Social History Narrative  . None    No past medical history on file.   Patient Active Problem List   Diagnosis Date Noted  . Postconcussive syndrome 12/14/2016  . Fatigue 10/25/2015  . Hypercholesterolemia 10/25/2015  . BP (high blood pressure) 10/25/2015  . Acute onset aura migraine 09/14/2008  . 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 Allergic rhinitis 07/05/2007    Past Surgical History:  Procedure Laterality Date  . BREAST CYST ASPIRATION Right 10+ yrs ago    Family History        No family status information on file.        Her family history is not on file.     Allergies  Allergen Reactions  . Ciprofloxacin Hives  . Codeine Nausea And Vomiting  . Gabapentin Nausea And Vomiting  . Hydrocodone-Acetaminophen Nausea And Vomiting  . Hydromorphone Hcl Nausea And Vomiting  . Oxycodone-Acetaminophen Nausea And Vomiting  . Promethazine Hcl Nausea And Vomiting  . Tramadol Nausea And Vomiting     Current Outpatient Prescriptions:  .  albuterol (PROVENTIL HFA;VENTOLIN HFA) 108 (90 Base) MCG/ACT inhaler, Inhale 2 puffs into the lungs every 6 (six) hours as needed., Disp: 1 Inhaler, Rfl: 3 .  montelukast (SINGULAIR) 10 MG tablet, Take 1 tablet (10 mg total) by mouth at bedtime., Disp: 30 tablet, Rfl: 3 .  norethindrone-ethinyl estradiol (JUNEL FE,GILDESS FE,LOESTRIN FE) 1-20 MG-MCG tablet, Take 1 tablet by mouth daily., Disp: , Rfl:  .  ondansetron (ZOFRAN ODT) 8 MG disintegrating tablet, Take 1 tablet (8 mg total) by mouth every 8 (eight) hours as needed for nausea or vomiting., Disp: 10 tablet, Rfl: 0 .  triamterene-hydrochlorothiazide (MAXZIDE-25) 37.5-25 MG tablet, Take 1 tablet by mouth daily., Disp: 90 tablet, Rfl: 3   Patient Care Team: Mar Daring, PA-C as PCP - General (Family Medicine)      Objective:   Vitals: BP 132/84 (BP  Location: Left Arm, Patient Position: Sitting, Cuff Size: Large)   Pulse 60   Temp 98.2 F (36.8 C) (Oral)   Resp 16   Ht 5' 4"  (1.626 m)   Wt 165 lb (74.8 kg)   SpO2 99%   BMI 28.32 kg/m    Vitals:   12/18/16 1012  BP: 132/84  Pulse: 60  Resp: 16  Temp: 98.2 F (36.8 C)  TempSrc: Oral  SpO2: 99%  Weight: 165 lb (74.8 kg)  Height: 5' 4"  (1.626 m)     Physical Exam  Constitutional: She is oriented to person, place, and time. She appears well-developed and well-nourished. No distress.  HENT:  Head: Normocephalic and atraumatic.  Right Ear: External ear normal.  Left Ear: External ear normal.  Nose: Nose normal.  Mouth/Throat: Oropharynx is clear and moist. No oropharyngeal exudate.  Eyes: Pupils are equal, round, and reactive to light. Conjunctivae and EOM are normal. Right eye exhibits no discharge. Left eye exhibits no discharge. No scleral icterus.  Neck: Normal range of motion. Neck supple. No JVD present. No tracheal deviation present. No thyromegaly present.  Cardiovascular: Normal rate, regular rhythm, normal heart sounds and intact distal pulses.  Exam reveals no gallop and no friction rub.   No murmur heard. Pulmonary/Chest: Effort normal and breath sounds normal. No respiratory distress. She has no wheezes. She has no rales. She exhibits no tenderness. Right breast exhibits no inverted nipple, no mass, no nipple discharge, no skin change and no tenderness. Left breast exhibits no inverted nipple, no mass, no nipple discharge, no skin change and no tenderness. Breasts are symmetrical.  Abdominal: Soft. Bowel sounds are normal. She exhibits no distension and no mass. There is no tenderness. There is no rebound and no guarding.  Musculoskeletal: Normal range of motion. She exhibits no edema or tenderness.  Lymphadenopathy:    She has no cervical adenopathy.  Neurological: She is alert and oriented to person, place, and time.  Skin: Skin is warm and dry. No rash noted.  She is not diaphoretic.  Psychiatric: She has a normal mood and affect. Her behavior is normal. Judgment and thought content normal.  Vitals reviewed.    Depression Screen PHQ 2/9 Scores 12/18/2016 08/07/2014  PHQ - 2 Score 0 0  PHQ- 9 Score 0 -      Assessment & Plan:     Routine Health Maintenance and Physical Exam  Exercise Activities and Dietary recommendations Goals    None      Immunization History  Administered Date(s) Administered  . Influenza-Unspecified 11/17/2016    Health Maintenance  Topic Date Due  . HIV Screening  12/24/1983  . TETANUS/TDAP  12/24/1987  . PAP SMEAR  12/23/1989  . INFLUENZA VACCINE  Completed     Discussed health benefits of physical activity, and encouraged her to engage in regular exercise appropriate for her age and condition.    1. Annual physical exam Normal physical exam today. Will check labs as below and f/u pending lab results. If labs are stable and WNL she will  not need to have these rechecked for one year at her next annual physical exam. She is to call the office in the meantime if she has any acute issue, questions or concerns. - TSH  2. Cervical cancer screening Done at [facility] 02/23/16. Report requested today.  3. Screening for breast cancer Breast exam today was normal. There is no family history of breast cancer. She does perform regular self breast exams. Mammogram was ordered as below. Information for [facility] was given to patient so she may schedule her mammogram at her convenience. - MM Digital Diagnostic Bilat; Future  4. Postconcussive syndrome Slow improvements. Will add flexeril for upper trapezius muscle spasm. ROM exercises verbally instructed to patient. She is going to try exercises at home with muscle relaxer and will call if headache persists. Will consider referral to PT if no improvement. - cyclobenzaprine (FLEXERIL) 5 MG tablet; Take 0.5-1 tablets (2.5-5 mg total) by mouth 3 (three)  times daily as needed for muscle spasms.  Dispense: 30 tablet; Refill: 0  5. Essential hypertension Continue Maxzide 37.5-25mg. Will check labs as below and f/u pending results. - CBC with Differential/Platelet - Comprehensive metabolic panel - TSH  6. Hypercholesterolemia Diet controlled. Will check labs as below and f/u pending results. - Lipid panel  7. Family history of diabetes mellitus (DM) Will check labs as below and f/u pending results. - Hemoglobin A1c - TSH  8. Meningioma ([place]) Incidentally found on CT for concussion.  - Ambulatory referral to Neurology  9. Muscle spasm See above medical treatment plan for #4. - cyclobenzaprine (FLEXERIL) 5 MG tablet; Take 0.5-1 tablets (2.5-5 mg total) by mouth 3 (three) times daily as needed for muscle spasms.  Dispense: 30 tablet; Refill: 0  --------------------------------------------------------------------    Mar Daring, PA-C  [facility]

## 2016-12-18 NOTE — Telephone Encounter (Signed)
-----   Message from Mar Daring, [place] sent at 12/16/2016  5:41 PM EDT ----- There is a small meningioma noted on CT, no mass effect or other associated signs that would be worrisome. No bleed noted. Can refer to Neuro for meningioma follow ups (normally follow with imaging every so often) to make sure no changes in size if patient agreeable.

## 2016-12-18 NOTE — Patient Instructions (Signed)
Meningioma Meningioma is a tumor that occurs in the thin tissue that covers the brain and spinal cord (meninges). Meningiomas are usually not cancerous (benign) and do not spread to other areas. In rare cases, a meningioma may become cancerous (malignant). What are the causes? In many cases, the cause of this condition is not known. In some cases, meningioma may be caused by:  Having a genetic disorder that causes multiple soft tumors (neurofibromatosis 2).  A change in certain genes (genetic mutation).  What increases the risk? You are more likely to develop this condition if:  You have been exposed to radiation.  You are an older woman. Older women have a higher risk of meningiomas than men or children. However, men have a higher risk of malignant meningiomas.  You have injured your head in the past.  You have a history of breast cancer.  What are the signs or symptoms? Symptoms of this condition usually begin very slowly. The symptoms may depend on the size and location of the tumor. Possible symptoms include:  Headaches.  Nausea and vomiting.  Vision changes.  Hearing changes.  Loss of the sense of smell.  Fits of uncontrolled movements (seizures).  Weakness or numbness on one side of the body or in an arm or leg.  Mood or personality changes.  Problems with memory or thinking.  How is this diagnosed? This condition is diagnosed based on:  Results of brain imaging tests, such as a CT scan or MRI.  Removal and testing of a sample of the tumor (biopsy). This may be done to confirm the diagnosis and to help determine the best treatment for the condition.  How is this treated? You may not have treatment until your symptoms start to affect your daily activities. This is because meningioma grows so slowly, and your health care provider may prefer to monitor its growth before starting treatment. If you do need treatment, it may include:  Medicines to decrease brain  swelling and improve symptoms (steroids).  High-energy rays (radiation therapy) to shrink or kill the tumor.  Anti-cancer medicines (chemotherapy) to shrink or kill the tumor. Chemotherapy has many side effects because it also kills healthy cells.  Targeted therapy. This kills cancerous cells without affecting normal cells.  Surgery to remove as much of the tumor as possible.  Follow these instructions at home:  Take over-the-counter and prescription medicines only as told by your health care provider.  Keep all follow-up visits as told by your health care provider. This is important. You may need regular visits to monitor the growth of your tumor. Contact a health care provider if:  You have symptoms that come back.  You have diarrhea.  You vomit.  You have abdominal pain.  You cannot eat or drink as much as you need.  You are weaker or more tired than usual.  You are losing weight without trying. Get help right away if:  Your diarrhea, vomiting, or abdominal pain does not go away.  You have new symptoms, such as vision problems or difficulty walking.  You have a seizure.  You have bleeding that does not stop.  You have trouble breathing.  You have a fever. Summary  Meningioma is a tumor that occurs in the thin tissue that covers the brain and spinal cord (meninges).  Meningiomas are usually benign, which means they are not cancerous and do not spread to other areas.  Symptoms of this condition usually begin very slowly. The symptoms may depend on the  size and location of the tumor.  Your tumor may be monitored over time. You may not need treatment until your tumor starts to affect your daily life. This information is not intended to replace advice given to you by your health care provider. Make sure you discuss any questions you have with your health care provider. Document Released: 02/28/2013 Document Revised: 02/28/2016 Document Reviewed: 02/28/2016 Elsevier  Interactive Patient Education  2017 [employer].

## 2016-12-19 LAB — COMPLETE METABOLIC PANEL WITH GFR
AG Ratio: 1.2 (calc) (ref 1.0–2.5)
ALKALINE PHOSPHATASE (APISO): 49 U/L (ref 33–115)
ALT: 18 U/L (ref 6–29)
AST: 14 U/L (ref 10–35)
Albumin: 3.9 g/dL (ref 3.6–5.1)
BUN: 12 mg/dL (ref 7–25)
CO2: 29 mmol/L (ref 20–32)
CREATININE: 1.01 mg/dL (ref 0.50–1.10)
Calcium: 9 mg/dL (ref 8.6–10.2)
Chloride: 103 mmol/L (ref 98–110)
GFR, Est African American: 77 mL/min/{1.73_m2} (ref >=60)
GFR, Est Non African American: 66 mL/min/{1.73_m2} (ref >=60)
GLUCOSE: 86 mg/dL (ref 65–99)
Globulin: 3.2 g/dL (calc) (ref 1.9–3.7)
Potassium: 4.2 mmol/L (ref 3.5–5.3)
SODIUM: 139 mmol/L (ref 135–146)
Total Bilirubin: 0.5 mg/dL (ref 0.2–1.2)
Total Protein: 7.1 g/dL (ref 6.1–8.1)

## 2016-12-19 LAB — CBC WITH DIFFERENTIAL/PLATELET
Basophils Absolute: 31 cells/uL (ref 0–200)
Basophils Relative: 0.4 %
Eosinophils Absolute: 211 cells/uL (ref 15–500)
Eosinophils Relative: 2.7 %
HEMATOCRIT: 41.9 % (ref 35.0–45.0)
Hemoglobin: 14.6 g/dL (ref 11.7–15.5)
LYMPHS ABS: 3838 {cells}/uL (ref 850–3900)
MCH: 29.9 pg (ref 27.0–33.0)
MCHC: 34.8 g/dL (ref 32.0–36.0)
MCV: 85.9 fL (ref 80.0–100.0)
MPV: 9.9 fL (ref 7.5–12.5)
Monocytes Relative: 5.2 %
Neutro Abs: 3315 cells/uL (ref 1500–7800)
Neutrophils Relative %: 42.5 %
Platelets: 287 10*3/uL (ref 140–400)
RBC: 4.88 10*6/uL (ref 3.80–5.10)
RDW: 12 % (ref 11.0–15.0)
Total Lymphocyte: 49.2 %
WBC mixed population: 406 cells/uL (ref 200–950)
WBC: 7.8 10*3/uL (ref 3.8–10.8)

## 2016-12-19 LAB — LIPID PANEL
Cholesterol: 255 mg/dL — ABNORMAL HIGH (ref <200)
HDL: 59 mg/dL (ref >50)
LDL Cholesterol (Calc): 166 mg/dL (calc) — ABNORMAL HIGH
Non-HDL Cholesterol (Calc): 196 mg/dL (calc) — ABNORMAL HIGH (ref <130)
Total CHOL/HDL Ratio: 4.3 (calc) (ref <5.0)
Triglycerides: 153 mg/dL — ABNORMAL HIGH (ref <150)

## 2016-12-19 LAB — HEMOGLOBIN A1C
EAG (MMOL/L): 6.2 (calc)
HEMOGLOBIN A1C: 5.5 %{Hb} (ref <5.7)
MEAN PLASMA GLUCOSE: 111 (calc)

## 2016-12-19 LAB — TSH: TSH: 0.79 mIU/L

## 2016-12-21 ENCOUNTER — Telehealth: Payer: Self-pay

## 2016-12-21 NOTE — Telephone Encounter (Signed)
Per pt Thank you for calling.She saw the results in mychart.  Thanks,  -Ignace Mandigo

## 2016-12-21 NOTE — Telephone Encounter (Signed)
-----   Message from Mar Daring, [place] sent at 12/21/2016  9:07 AM EDT ----- Cholesterol slight improvement from last year but still elevated, could be from not fasting as well. All other labs are normal.

## 2016-12-22 ENCOUNTER — Encounter: Payer: Self-pay | Admitting: Physician Assistant

## 2016-12-25 ENCOUNTER — Other Ambulatory Visit: Payer: Self-pay | Admitting: Physician Assistant

## 2016-12-25 ENCOUNTER — Telehealth: Payer: Self-pay | Admitting: Physician Assistant

## 2016-12-25 DIAGNOSIS — Z1231 Encounter for screening mammogram for malignant neoplasm of breast: Secondary | ICD-10-CM

## 2016-12-25 NOTE — Telephone Encounter (Signed)
There is an order in Epic for a diagnostic mammogram but diagnosis is screening.Please advise,Thanks

## 2016-12-25 NOTE — Addendum Note (Signed)
Addended by: Mar Daring on: 12/25/2016 01:40 PM   Modules accepted: Orders

## 2016-12-25 NOTE — Telephone Encounter (Signed)
That was error. Will change order.

## 2017-01-08 DIAGNOSIS — D329 Benign neoplasm of meninges, unspecified: Secondary | ICD-10-CM | POA: Diagnosis not present

## 2017-01-08 DIAGNOSIS — S060X0D Concussion without loss of consciousness, subsequent encounter: Secondary | ICD-10-CM | POA: Diagnosis not present

## 2017-02-17 ENCOUNTER — Encounter: Payer: Self-pay | Admitting: Physician Assistant

## 2017-03-16 ENCOUNTER — Encounter: Payer: Self-pay | Admitting: Physician Assistant

## 2017-03-16 DIAGNOSIS — Z3041 Encounter for surveillance of contraceptive pills: Secondary | ICD-10-CM

## 2017-03-16 MED ORDER — NORETHIN ACE-ETH ESTRAD-FE 1-20 MG-MCG PO TABS: 1.0000 | ORAL_TABLET | Freq: Every day | ORAL | 12 refills | Status: DC

## 2017-03-18 ENCOUNTER — Telehealth: Payer: Self-pay

## 2017-03-18 NOTE — Telephone Encounter (Signed)
Kathlee Nations from [employer] called that [place] prescribed patients oral contraceptive pills for the first time. They have on file what the patient was taking since no changes were made. Per Tawanna Sat is ok to to change to what the patient was taking before.  Thanks,  -Alejandra Barna

## 2017-04-02 ENCOUNTER — Ambulatory Visit
Admission: RE | Admit: 2017-04-02 | Discharge: 2017-04-02 | Disposition: A | Discharge status: Home / Self Care | Payer: 59 | Source: Ambulatory Visit | Attending: Physician Assistant | Admitting: Physician Assistant

## 2017-04-02 DIAGNOSIS — Z1231 Encounter for screening mammogram for malignant neoplasm of breast: Secondary | ICD-10-CM | POA: Diagnosis not present

## 2017-04-02 DIAGNOSIS — Z1239 Encounter for other screening for malignant neoplasm of breast: Secondary | ICD-10-CM

## 2017-04-14 ENCOUNTER — Telehealth: Payer: Self-pay | Admitting: Physician Assistant

## 2017-04-14 NOTE — Telephone Encounter (Addendum)
Patient came in the office and brought in [place] form to be filled out by the provider. Patient would like to be called when forms are finished by the provider. Forms where put in providers box to be filled out. Patient's CB# [phone] Thanks CC

## 2017-04-14 NOTE — Telephone Encounter (Signed)
Have you seen this ?

## 2017-04-15 NOTE — Telephone Encounter (Signed)
Form completed. Need to print med list

## 2017-04-15 NOTE — Telephone Encounter (Signed)
LM on vm.

## 2017-10-08 ENCOUNTER — Ambulatory Visit (INDEPENDENT_AMBULATORY_CARE_PROVIDER_SITE_OTHER): Payer: 59 | Admitting: Physician Assistant

## 2017-10-08 ENCOUNTER — Telehealth: Payer: Self-pay | Admitting: Physician Assistant

## 2017-10-08 ENCOUNTER — Encounter: Payer: Self-pay | Admitting: Physician Assistant

## 2017-10-08 VITALS — BP 142/80 | HR 71 | Temp 98.1°F | Resp 16 | Wt 168.4 lb

## 2017-10-08 DIAGNOSIS — L299 Pruritus, unspecified: Secondary | ICD-10-CM | POA: Diagnosis not present

## 2017-10-08 DIAGNOSIS — W57XXXA Bitten or stung by nonvenomous insect and other nonvenomous arthropods, initial encounter: Secondary | ICD-10-CM | POA: Diagnosis not present

## 2017-10-08 DIAGNOSIS — B379 Candidiasis, unspecified: Secondary | ICD-10-CM

## 2017-10-08 DIAGNOSIS — T3695XA Adverse effect of unspecified systemic antibiotic, initial encounter: Principal | ICD-10-CM

## 2017-10-08 MED ORDER — FLUCONAZOLE 150 MG PO TABS: 150.0000 mg | ORAL_TABLET | Freq: Once | ORAL | 0 refills | Status: AC

## 2017-10-08 MED ORDER — MOMETASONE FUROATE 0.1 % EX CREA: 1.0000 "application " | TOPICAL_CREAM | Freq: Every day | CUTANEOUS | 0 refills | Status: DC

## 2017-10-08 MED ORDER — SULFAMETHOXAZOLE-TRIMETHOPRIM 800-160 MG PO TABS: 1.0000 | ORAL_TABLET | Freq: Two times a day (BID) | ORAL | 0 refills | Status: DC

## 2017-10-08 NOTE — Progress Notes (Signed)
Patient: Ashley [name] Female    DOB: 1970-04-30   47 y.o.   MRN: [id number] Visit Date: 10/08/2017  Today's Provider: Mar Daring, PA-C   Chief Complaint  Patient presents with  . Insect Bite   Subjective:    HPI Patient here today with c/o possible insect bite on the of lower leg, right side. She reports it has been there for 3 weeks. She first notice a black spot 3 weeks ago. It has some redness and cellulitis in it. Is not painful. Reports it was tender and sore at the beginning. Reports some drainage. Treatments tried:anti itch cream around it.    Allergies  Allergen Reactions  . Ciprofloxacin Hives  . Codeine Nausea And Vomiting  . Gabapentin Nausea And Vomiting  . Hydrocodone-Acetaminophen Nausea And Vomiting  . Hydromorphone Hcl Nausea And Vomiting  . Oxycodone-Acetaminophen Nausea And Vomiting  . Promethazine Hcl Nausea And Vomiting  . Tramadol Nausea And Vomiting     Current Outpatient Medications:  .  albuterol (PROVENTIL HFA;VENTOLIN HFA) 108 (90 Base) MCG/ACT inhaler, Inhale 2 puffs into the lungs every 6 (six) hours as needed., Disp: 1 Inhaler, Rfl: 3 .  norethindrone-ethinyl estradiol (JUNEL FE,GILDESS FE,LOESTRIN FE) 1-20 MG-MCG tablet, Take 1 tablet by mouth daily., Disp: 1 Package, Rfl: 12 .  triamterene-hydrochlorothiazide (MAXZIDE-25) 37.5-25 MG tablet, Take 1 tablet by mouth daily., Disp: 90 tablet, Rfl: 3 .  cyclobenzaprine (FLEXERIL) 5 MG tablet, Take 0.5-1 tablets (2.5-5 mg total) by mouth 3 (three) times daily as needed for muscle spasms. (Patient not taking: Reported on 10/08/2017), Disp: 30 tablet, Rfl: 0 .  montelukast (SINGULAIR) 10 MG tablet, Take 1 tablet (10 mg total) by mouth at bedtime. (Patient not taking: Reported on 10/08/2017), Disp: 30 tablet, Rfl: 3 .  ondansetron (ZOFRAN ODT) 8 MG disintegrating tablet, Take 1 tablet (8 mg total) by mouth every 8 (eight) hours as needed for nausea or vomiting. (Patient not taking: Reported on  10/08/2017), Disp: 10 tablet, Rfl: 0  Review of Systems  Constitutional: Negative.  Negative for fever.  Respiratory: Negative.   Cardiovascular: Negative.   Skin: Positive for rash and wound.    Social History   Tobacco Use  . Smoking status: Never Smoker  . Smokeless tobacco: Never Used  Substance Use Topics  . Alcohol use: No    Alcohol/week: 0.0 oz   Objective:   BP (!) 142/80 (BP Location: Left Arm, Patient Position: Sitting, Cuff Size: Normal)   Pulse 71   Temp 98.1 F (36.7 C) (Oral)   Resp 16   Wt 168 lb 6.4 oz (76.4 kg)   BMI 28.91 kg/m  Vitals:   10/08/17 1114  BP: (!) 142/80  Pulse: 71  Resp: 16  Temp: 98.1 F (36.7 C)  TempSrc: Oral  Weight: 168 lb 6.4 oz (76.4 kg)     Physical Exam  Constitutional: She appears well-developed and well-nourished. No distress.  HENT:  Head: Normocephalic and atraumatic.  Eyes: EOM are normal.  Neck: Normal range of motion. Neck supple.  Pulmonary/Chest: Effort normal. No respiratory distress.  Skin: Lesion noted.     Vitals reviewed.       Assessment & Plan:     1. Bug bite, initial encounter Central ulceration with surrounding cellulitis changes noted. No drainage. Indurated. Start bactrim as below. Call if symptoms worsen.  - sulfamethoxazole-trimethoprim (BACTRIM DS,SEPTRA DS) 800-160 MG tablet; Take 1 tablet by mouth 2 (two) times daily.  Dispense: 20 tablet; Refill:  0  2. Itching Will give mometasone cream to apply topically for itching.  - mometasone (ELOCON) 0.1 % cream; Apply 1 application topically daily.  Dispense: 15 g; Refill: 0       Mar Daring, PA-C  [facility]

## 2017-10-08 NOTE — Telephone Encounter (Signed)
Please Review

## 2017-10-08 NOTE — Telephone Encounter (Signed)
Pt states she was given RX for antibiotics and she would like to see if Tawanna Sat would call in [place] into [facility] employee pharmacy.

## 2017-10-08 NOTE — Telephone Encounter (Signed)
Sent in

## 2017-10-08 NOTE — Patient Instructions (Signed)
Cellulitis, Adult Cellulitis is a skin infection. The infected area is usually red and sore. This condition occurs most often in the arms and lower legs. It is very important to get treated for this condition. Follow these instructions at home:  Take over-the-counter and prescription medicines only as told by your doctor.  If you were prescribed an antibiotic medicine, take it as told by your doctor. Do not stop taking the antibiotic even if you start to feel better.  Drink enough fluid to keep your pee (urine) clear or pale yellow.  Do not touch or rub the infected area.  Raise (elevate) the infected area above the level of your heart while you are sitting or lying down.  Place warm or cold wet cloths (warm or cold compresses) on the infected area. Do this as told by your doctor.  Keep all follow-up visits as told by your doctor. This is important. These visits let your doctor make sure your infection is not getting worse. Contact a doctor if:  You have a fever.  Your symptoms do not get better after 1-2 days of treatment.  Your bone or joint under the infected area starts to hurt after the skin has healed.  Your infection comes back. This can happen in the same area or another area.  You have a swollen bump in the infected area.  You have new symptoms.  You feel ill and also have muscle aches and pains. Get help right away if:  Your symptoms get worse.  You feel very sleepy.  You throw up (vomit) or have watery poop (diarrhea) for a long time.  There are red streaks coming from the infected area.  Your red area gets larger.  Your red area turns darker. This information is not intended to replace advice given to you by your health care provider. Make sure you discuss any questions you have with your health care provider. Document Released: 08/12/2007 Document Revised: 08/01/2015 Document Reviewed: 01/02/2015 Elsevier Interactive Patient Education  2018 [place], Adult An insect bite can make your skin red, itchy, and swollen. Some insects can spread disease to people with a bite. However, most insect bites do not lead to disease, and most are not serious. Follow these instructions at home: Bite area care  Do not scratch the bite area.  Keep the bite area clean and dry.  Wash the bite area every day with soap and water as told by your doctor.  Check the bite area every day for signs of infection. Check for: ? More redness, swelling, or pain. ? Fluid or blood. ? Warmth. ? Pus. Managing pain, itching, and swelling  You may put any of these on the bite area as told by your doctor: ? A baking soda paste. ? Cortisone cream. ? Calamine lotion.  If directed, put ice on the bite area. ? Put ice in a plastic bag. ? Place a towel between your skin and the bag. ? Leave the ice on for 20 minutes, 2-3 times a day. Medicines  Take medicines or put medicines on your skin only as told by your doctor.  If you were prescribed an antibiotic medicine, use it as told by your doctor. Do not stop using the antibiotic even if your condition improves. General instructions  Keep all follow-up visits as told by your doctor. This is important. How is this prevented? To help you have a lower risk of insect bites:  When you are outside, wear clothing that  covers your arms and legs.  Use insect repellent. The best insect repellents have: ? An active ingredient of DEET, picaridin, oil of lemon eucalyptus (OLE), or IR3535. ? Higher amounts of DEET or another active ingredient than other repellents have.  If your home windows do not have screens, think about putting some in.  Contact a doctor if:  You have more redness, swelling, or pain in the bite area.  You have fluid, blood, or pus coming from the bite area.  The bite area feels warm.  You have a fever. Get help right away if:  You have joint pain.  You have a rash.  You have  shortness of breath.  You feel more tired or sleepy than you normally do.  You have neck pain.  You have a headache.  You feel weaker than you normally do.  You have chest pain.  You have pain in your belly.  You feel sick to your stomach (nauseous) or you throw up (vomit). Summary  An insect bite can make your skin red, itchy, and swollen.  Do not scratch the bite area, and keep it clean and dry.  Ice can help with pain and itching from the bite. This information is not intended to replace advice given to you by your health care provider. Make sure you discuss any questions you have with your health care provider. Document Released: 02/21/2000 Document Revised: 09/26/2015 Document Reviewed: 07/11/2014 Elsevier Interactive Patient Education  2018 [employer].

## 2017-10-11 ENCOUNTER — Encounter: Payer: Self-pay | Admitting: Physician Assistant

## 2017-10-12 ENCOUNTER — Telehealth: Payer: Self-pay

## 2017-10-12 NOTE — Telephone Encounter (Signed)
Patient reports that she sent you pictures through my chart message.

## 2017-10-12 NOTE — Telephone Encounter (Signed)
Pt advised and agrees with plan.

## 2017-10-12 NOTE — Telephone Encounter (Signed)
Stop antibiotic. Continue benadryl as needed. How does the bug bite look?

## 2017-10-12 NOTE — Telephone Encounter (Signed)
Lets try going without the medication. If it starts to look bad again, please call and I will send in another medication.

## 2017-10-12 NOTE — Telephone Encounter (Signed)
Patient reports that she thinks she is having an allergic reaction to the antibiotic(Bactrim). She reports that she is having a rash on her abdomen left side and on her leg. You stop the antibiotic since yesterday. She reports that she was itching and took Benadryl and applied anti-itch cream. She reports that she sent a pictures through my chart.   Thanks,  -Orianna Biskup

## 2018-03-08 ENCOUNTER — Other Ambulatory Visit: Payer: Self-pay | Admitting: Physician Assistant

## 2018-03-08 DIAGNOSIS — Z3041 Encounter for surveillance of contraceptive pills: Secondary | ICD-10-CM

## 2018-03-16 ENCOUNTER — Encounter: Payer: Self-pay | Admitting: Physician Assistant

## 2018-03-16 DIAGNOSIS — Z3041 Encounter for surveillance of contraceptive pills: Secondary | ICD-10-CM

## 2018-03-16 MED ORDER — NORETHINDRONE ACET-ETHINYL EST 1-20 MG-MCG PO TABS: 1.0000 | ORAL_TABLET | Freq: Every day | ORAL | 1 refills | Status: DC

## 2018-04-14 ENCOUNTER — Ambulatory Visit (INDEPENDENT_AMBULATORY_CARE_PROVIDER_SITE_OTHER): Payer: 59 | Admitting: Physician Assistant

## 2018-04-14 ENCOUNTER — Encounter: Payer: Self-pay | Admitting: Physician Assistant

## 2018-04-14 VITALS — BP 162/93 | HR 79 | Temp 98.3°F | Wt 166.8 lb

## 2018-04-14 DIAGNOSIS — R300 Dysuria: Secondary | ICD-10-CM | POA: Diagnosis not present

## 2018-04-14 DIAGNOSIS — N3090 Cystitis, unspecified without hematuria: Secondary | ICD-10-CM

## 2018-04-14 DIAGNOSIS — B373 Candidiasis of vulva and vagina: Secondary | ICD-10-CM | POA: Diagnosis not present

## 2018-04-14 DIAGNOSIS — B3731 Acute candidiasis of vulva and vagina: Secondary | ICD-10-CM

## 2018-04-14 LAB — POCT URINALYSIS DIPSTICK
Bilirubin, UA: NEGATIVE
Glucose, UA: NEGATIVE
Ketones, UA: NEGATIVE
Protein, UA: POSITIVE — AB
Spec Grav, UA: 1.01 (ref 1.010–1.025)
Urobilinogen, UA: 0.2 E.U./dL
pH, UA: 7.5 (ref 5.0–8.0)

## 2018-04-14 MED ORDER — NITROFURANTOIN MONOHYD MACRO 100 MG PO CAPS: 100.0000 mg | ORAL_CAPSULE | Freq: Two times a day (BID) | ORAL | 0 refills | Status: AC

## 2018-04-14 MED ORDER — FLUCONAZOLE 150 MG PO TABS: ORAL_TABLET | ORAL | 0 refills | Status: DC

## 2018-04-14 NOTE — Progress Notes (Signed)
Patient: Ashley [name] Female    DOB: 1970-10-16   48 y.o.   MRN: [id number] Visit Date: 04/14/2018  Today's Provider: Trinna Post, PA-C   Chief Complaint  Patient presents with  . Flank Pain   Subjective:     HPI Patient comes in this morning complaining of flank pain. Patient complains of having burning and pain with urination. She has also noticed blood when she wipes. Patient does have a history of kidney stone.  Allergies  Allergen Reactions  . Ciprofloxacin Hives  . Codeine Nausea And Vomiting  . Gabapentin Nausea And Vomiting  . Hydrocodone-Acetaminophen Nausea And Vomiting  . Hydromorphone Hcl Nausea And Vomiting  . Oxycodone-Acetaminophen Nausea And Vomiting  . Promethazine Hcl Nausea And Vomiting  . Tramadol Nausea And Vomiting  . Sulfa Antibiotics Rash    hives     Current Outpatient Medications:  .  albuterol (PROVENTIL HFA;VENTOLIN HFA) 108 (90 Base) MCG/ACT inhaler, Inhale 2 puffs into the lungs every 6 (six) hours as needed., Disp: 1 Inhaler, Rfl: 3 .  norethindrone-ethinyl estradiol (MICROGESTIN,JUNEL,LOESTRIN) 1-20 MG-MCG tablet, Take 1 tablet by mouth daily., Disp: 28 tablet, Rfl: 1 .  triamterene-hydrochlorothiazide (MAXZIDE-25) 37.5-25 MG tablet, Take 1 tablet by mouth daily., Disp: 90 tablet, Rfl: 3 .  cyclobenzaprine (FLEXERIL) 5 MG tablet, Take 0.5-1 tablets (2.5-5 mg total) by mouth 3 (three) times daily as needed for muscle spasms. (Patient not taking: Reported on 10/08/2017), Disp: 30 tablet, Rfl: 0 .  mometasone (ELOCON) 0.1 % cream, Apply 1 application topically daily. (Patient not taking: Reported on 04/14/2018), Disp: 15 g, Rfl: 0 .  montelukast (SINGULAIR) 10 MG tablet, Take 1 tablet (10 mg total) by mouth at bedtime. (Patient not taking: Reported on 10/08/2017), Disp: 30 tablet, Rfl: 3 .  ondansetron (ZOFRAN ODT) 8 MG disintegrating tablet, Take 1 tablet (8 mg total) by mouth every 8 (eight) hours as needed for nausea or vomiting. (Patient  not taking: Reported on 10/08/2017), Disp: 10 tablet, Rfl: 0 .  sulfamethoxazole-trimethoprim (BACTRIM DS,SEPTRA DS) 800-160 MG tablet, Take 1 tablet by mouth 2 (two) times daily. (Patient not taking: Reported on 04/14/2018), Disp: 20 tablet, Rfl: 0  Review of Systems  Gastrointestinal: Positive for nausea.  Genitourinary: Positive for dysuria, flank pain, frequency, pelvic pain and urgency.    Social History   Tobacco Use  . Smoking status: Never Smoker  . Smokeless tobacco: Never Used  Substance Use Topics  . Alcohol use: No    Alcohol/week: 0.0 standard drinks      Objective:   BP (!) 162/93 (BP Location: Left Arm, Patient Position: Sitting, Cuff Size: Normal)   Pulse 79   Temp 98.3 F (36.8 C) (Oral)   Wt 166 lb 12.8 oz (75.7 kg)   SpO2 99%   BMI 28.63 kg/m  Vitals:   04/14/18 0852  BP: (!) 162/93  Pulse: 79  Temp: 98.3 F (36.8 C)  TempSrc: Oral  SpO2: 99%  Weight: 166 lb 12.8 oz (75.7 kg)     Physical Exam Constitutional:      Appearance: Normal appearance.  Cardiovascular:     Rate and Rhythm: Normal rate and regular rhythm.     Heart sounds: Normal heart sounds.  Pulmonary:     Effort: Pulmonary effort is normal.     Breath sounds: Normal breath sounds.  Abdominal:     General: Abdomen is flat.     Palpations: Abdomen is soft.     Tenderness: There is  abdominal tenderness in the suprapubic area. There is no right CVA tenderness, left CVA tenderness or guarding.  Skin:    General: Skin is warm and dry.  Neurological:     Mental Status: She is alert and oriented to person, place, and time. Mental status is at baseline.  Psychiatric:        Mood and Affect: Mood normal.        Behavior: Behavior normal.         Assessment & Plan    1. Cystitis  May take AZO tablets for more immediate symptomatic relief. Abx as below. Culture pending. Please call back if not improving.   - nitrofurantoin, macrocrystal-monohydrate, (MACROBID) 100 MG capsule; Take 1  capsule (100 mg total) by mouth 2 (two) times daily for 5 days.  Dispense: 10 capsule; Refill: 0  2. Dysuria  - POCT urinalysis dipstick - Urine Culture  3. Vaginal candida  - fluconazole (DIFLUCAN) 150 MG tablet; Take 1 pill on day 1. Take 1 pill 3 days later if symptoms persist.  Dispense: 2 tablet; Refill: 0  The entirety of the information documented in the History of Present Illness, Review of Systems and Physical Exam were personally obtained by me. Portions of this information were initially documented by Lynford Humphrey, CMA and reviewed by me for thoroughness and accuracy.   Return if symptoms worsen or fail to improve.      Trinna Post, PA-C  [facility]

## 2018-04-14 NOTE — Patient Instructions (Signed)
Azo TABLETS for urinary pain - will turn urine orange    Urinary Tract Infection, Adult  A urinary tract infection (UTI) is an infection of any part of the urinary tract. The urinary tract includes the kidneys, ureters, bladder, and urethra. These organs make, store, and get rid of urine in the body. Your health care provider may use other names to describe the infection. An upper UTI affects the ureters and kidneys (pyelonephritis). A lower UTI affects the bladder (cystitis) and urethra (urethritis). What are the causes? Most urinary tract infections are caused by bacteria in your genital area, around the entrance to your urinary tract (urethra). These bacteria grow and cause inflammation of your urinary tract. What increases the risk? You are more likely to develop this condition if:  You have a urinary catheter that stays in place (indwelling).  You are not able to control when you urinate or have a bowel movement (you have incontinence).  You are female and you: ? Use a spermicide or diaphragm for birth control. ? Have low estrogen levels. ? Are pregnant.  You have certain genes that increase your risk (genetics).  You are sexually active.  You take antibiotic medicines.  You have a condition that causes your flow of urine to slow down, such as: ? An enlarged prostate, if you are female. ? Blockage in your urethra (stricture). ? A kidney stone. ? A nerve condition that affects your bladder control (neurogenic bladder). ? Not getting enough to drink, or not urinating often.  You have certain medical conditions, such as: ? Diabetes. ? A weak disease-fighting system (immunesystem). ? Sickle cell disease. ? Gout. ? Spinal cord injury. What are the signs or symptoms? Symptoms of this condition include:  Needing to urinate right away (urgently).  Frequent urination or passing small amounts of urine frequently.  Pain or burning with urination.  Blood in the urine.  Urine  that smells bad or unusual.  Trouble urinating.  Cloudy urine.  Vaginal discharge, if you are female.  Pain in the abdomen or the lower back. You may also have:  Vomiting or a decreased appetite.  Confusion.  Irritability or tiredness.  A fever.  Diarrhea. The first symptom in older adults may be confusion. In some cases, they may not have any symptoms until the infection has worsened. How is this diagnosed? This condition is diagnosed based on your medical history and a physical exam. You may also have other tests, including:  Urine tests.  Blood tests.  Tests for sexually transmitted infections (STIs). If you have had more than one UTI, a cystoscopy or imaging studies may be done to determine the cause of the infections. How is this treated? Treatment for this condition includes:  Antibiotic medicine.  Over-the-counter medicines to treat discomfort.  Drinking enough water to stay hydrated. If you have frequent infections or have other conditions such as a kidney stone, you may need to see a health care provider who specializes in the urinary tract (urologist). In rare cases, urinary tract infections can cause sepsis. Sepsis is a life-threatening condition that occurs when the body responds to an infection. Sepsis is treated in the hospital with IV antibiotics, fluids, and other medicines. Follow these instructions at home:  Medicines  Take over-the-counter and prescription medicines only as told by your health care provider.  If you were prescribed an antibiotic medicine, take it as told by your health care provider. Do not stop using the antibiotic even if you start to feel  better. General instructions  Make sure you: ? Empty your bladder often and completely. Do not hold urine for long periods of time. ? Empty your bladder after sex. ? Wipe from front to back after a bowel movement if you are female. Use each tissue one time when you wipe.  Drink enough fluid  to keep your urine pale yellow.  Keep all follow-up visits as told by your health care provider. This is important. Contact a health care provider if:  Your symptoms do not get better after 1-2 days.  Your symptoms go away and then return. Get help right away if you have:  Severe pain in your back or your lower abdomen.  A fever.  Nausea or vomiting. Summary  A urinary tract infection (UTI) is an infection of any part of the urinary tract, which includes the kidneys, ureters, bladder, and urethra.  Most urinary tract infections are caused by bacteria in your genital area, around the entrance to your urinary tract (urethra).  Treatment for this condition often includes antibiotic medicines.  If you were prescribed an antibiotic medicine, take it as told by your health care provider. Do not stop using the antibiotic even if you start to feel better.  Keep all follow-up visits as told by your health care provider. This is important. This information is not intended to replace advice given to you by your health care provider. Make sure you discuss any questions you have with your health care provider. Document Released: 12/03/2004 Document Revised: 09/02/2017 Document Reviewed: 09/02/2017 Elsevier Interactive Patient Education  2019 [employer].

## 2018-04-18 LAB — URINE CULTURE

## 2018-04-20 ENCOUNTER — Telehealth: Payer: Self-pay

## 2018-04-20 ENCOUNTER — Encounter: Payer: Self-pay | Admitting: Physician Assistant

## 2018-04-20 NOTE — Telephone Encounter (Signed)
-----   Message from Trinna Post, [place] sent at 04/19/2018  2:36 PM EST ----- Urine culture positive for e coli. Sensitive to everything. How does she feel?

## 2018-04-20 NOTE — Telephone Encounter (Signed)
[email or web address]

## 2018-04-21 NOTE — Telephone Encounter (Signed)
Patient was advised via mychart and Adriana.

## 2018-04-22 ENCOUNTER — Ambulatory Visit (INDEPENDENT_AMBULATORY_CARE_PROVIDER_SITE_OTHER): Payer: 59 | Admitting: Physician Assistant

## 2018-04-22 ENCOUNTER — Encounter: Payer: Self-pay | Admitting: Physician Assistant

## 2018-04-22 VITALS — BP 160/98 | HR 87 | Temp 98.3°F | Resp 16 | Ht 64.0 in | Wt 167.0 lb

## 2018-04-22 DIAGNOSIS — Z3041 Encounter for surveillance of contraceptive pills: Secondary | ICD-10-CM

## 2018-04-22 DIAGNOSIS — Z1211 Encounter for screening for malignant neoplasm of colon: Secondary | ICD-10-CM | POA: Diagnosis not present

## 2018-04-22 DIAGNOSIS — E7800 Pure hypercholesterolemia, unspecified: Secondary | ICD-10-CM

## 2018-04-22 DIAGNOSIS — Z0000 Encounter for general adult medical examination without abnormal findings: Secondary | ICD-10-CM

## 2018-04-22 DIAGNOSIS — Z833 Family history of diabetes mellitus: Secondary | ICD-10-CM

## 2018-04-22 DIAGNOSIS — I10 Essential (primary) hypertension: Secondary | ICD-10-CM | POA: Diagnosis not present

## 2018-04-22 DIAGNOSIS — Z6828 Body mass index (BMI) 28.0-28.9, adult: Secondary | ICD-10-CM

## 2018-04-22 DIAGNOSIS — D329 Benign neoplasm of meninges, unspecified: Secondary | ICD-10-CM | POA: Diagnosis not present

## 2018-04-22 DIAGNOSIS — Z1239 Encounter for other screening for malignant neoplasm of breast: Secondary | ICD-10-CM | POA: Diagnosis not present

## 2018-04-22 MED ORDER — NORETHINDRONE ACET-ETHINYL EST 1-20 MG-MCG PO TABS: 1.0000 | ORAL_TABLET | Freq: Every day | ORAL | 1 refills | Status: DC

## 2018-04-22 MED ORDER — TRIAMTERENE-HCTZ 37.5-25 MG PO TABS: 1.0000 | ORAL_TABLET | Freq: Every day | ORAL | 3 refills | Status: DC

## 2018-04-22 MED ORDER — AMLODIPINE BESYLATE 5 MG PO TABS: 5.0000 mg | ORAL_TABLET | Freq: Every day | ORAL | 3 refills | Status: DC

## 2018-04-22 MED ORDER — PHENTERMINE HCL 37.5 MG PO TABS: 37.5000 mg | ORAL_TABLET | Freq: Every day | ORAL | 2 refills | Status: DC

## 2018-04-22 NOTE — Patient Instructions (Signed)

## 2018-04-22 NOTE — Progress Notes (Signed)
Patient: Ashley [name], Female    DOB: July 06, 1970, 48 y.o.   MRN: [id number] Visit Date: 04/22/2018  Today's Provider: Mar Daring, PA-C   Chief Complaint  Patient presents with  . Annual Exam   Subjective:     Annual physical exam Ashley [name] is a 48 y.o. female who presents today for health maintenance and complete physical. She feels well. She reports exercising. She reports she is sleeping poorly, reports that when she takes Melatonin it makes her feel drowsy the next day.  Last CPE:02/17/2017 Pap:02/14/2016 Negative, HPV-Negative Mammogram:04/02/2017 -----------------------------------------------------------------   Review of Systems  Constitutional: Negative.   HENT: Negative.   Eyes: Negative.   Respiratory: Negative.   Cardiovascular: Negative.   Gastrointestinal: Negative.   Endocrine: Negative.   Genitourinary: Negative.   Musculoskeletal: Negative.   Skin: Negative.   Allergic/Immunologic: Positive for environmental allergies.  Neurological: Negative.   Hematological: Negative.   Psychiatric/Behavioral: Negative.     Social History      She  reports that she has never smoked. She has never used smokeless tobacco. She reports that she does not drink alcohol.       Social History   Socioeconomic History  . Marital status: Single    Spouse name: Not on file  . Number of children: Not on file  . Years of education: Not on file  . Highest education level: Not on file  Occupational History  . Not on file  Social Needs  . Financial resource strain: Not on file  . Food insecurity:    Worry: Not on file    Inability: Not on file  . Transportation needs:    Medical: Not on file    Non-medical: Not on file  Tobacco Use  . Smoking status: Never Smoker  . Smokeless tobacco: Never Used  Substance and Sexual Activity  . Alcohol use: No    Alcohol/week: 0.0 standard drinks  . Drug use: Not on file  . Sexual activity: Not on file    Lifestyle  . Physical activity:    Days per week: Not on file    Minutes per session: Not on file  . Stress: Not on file  Relationships  . Social connections:    Talks on phone: Not on file    Gets together: Not on file    Attends religious service: Not on file    Active member of club or organization: Not on file    Attends meetings of clubs or organizations: Not on file    Relationship status: Not on file  Other Topics Concern  . Not on file  Social History Narrative  . Not on file    History reviewed. No pertinent past medical history.   Patient Active Problem List   Diagnosis Date Noted  . Postconcussive syndrome 12/14/2016  . Fatigue 10/25/2015  . Hypercholesterolemia 10/25/2015  . BP (high blood pressure) 10/25/2015  . Acute onset aura migraine 09/14/2008  . 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 Allergic rhinitis 07/05/2007    Past Surgical History:  Procedure Laterality Date  . BREAST CYST ASPIRATION Right 10+ yrs ago    Family History        No family status information on file.        Her family history is negative for Breast cancer.      Allergies  Allergen Reactions  . Ciprofloxacin Hives  . Codeine Nausea And Vomiting  . Gabapentin Nausea And Vomiting  . Hydrocodone-Acetaminophen  Nausea And Vomiting  . Hydromorphone Hcl Nausea And Vomiting  . Oxycodone-Acetaminophen Nausea And Vomiting  . Promethazine Hcl Nausea And Vomiting  . Tramadol Nausea And Vomiting  . Sulfa Antibiotics Rash    hives     Current Outpatient Medications:  .  albuterol (PROVENTIL HFA;VENTOLIN HFA) 108 (90 Base) MCG/ACT inhaler, Inhale 2 puffs into the lungs every 6 (six) hours as needed., Disp: 1 Inhaler, Rfl: 3 .  norethindrone-ethinyl estradiol (MICROGESTIN,JUNEL,LOESTRIN) 1-20 MG-MCG tablet, Take 1 tablet by mouth daily., Disp: 28 tablet, Rfl: 1 .  triamterene-hydrochlorothiazide (MAXZIDE-25) 37.5-25 MG tablet, Take 1 tablet by mouth daily., Disp: 90 tablet, Rfl:  3 .  cyclobenzaprine (FLEXERIL) 5 MG tablet, Take 0.5-1 tablets (2.5-5 mg total) by mouth 3 (three) times daily as needed for muscle spasms. (Patient not taking: Reported on 10/08/2017), Disp: 30 tablet, Rfl: 0 .  fluconazole (DIFLUCAN) 150 MG tablet, Take 1 pill on day 1. Take 1 pill 3 days later if symptoms persist. (Patient not taking: Reported on 04/22/2018), Disp: 2 tablet, Rfl: 0 .  montelukast (SINGULAIR) 10 MG tablet, Take 1 tablet (10 mg total) by mouth at bedtime. (Patient not taking: Reported on 10/08/2017), Disp: 30 tablet, Rfl: 3   Patient Care Team: Mar Daring, PA-C as PCP - General (Family Medicine)    Objective:    Vitals: BP (!) 160/98 (BP Location: Left Arm, Patient Position: Sitting, Cuff Size: Large)   Pulse 87   Temp 98.3 F (36.8 C) (Oral)   Resp 16   Ht 5' 4"  (1.626 m)   Wt 167 lb (75.8 kg)   BMI 28.67 kg/m    Vitals:   04/22/18 1016  BP: (!) 160/98  Pulse: 87  Resp: 16  Temp: 98.3 F (36.8 C)  TempSrc: Oral  Weight: 167 lb (75.8 kg)  Height: 5' 4"  (1.626 m)     Physical Exam Vitals signs reviewed.  Constitutional:      General: She is not in acute distress.    Appearance: Normal appearance. She is well-developed. She is not diaphoretic.  HENT:     Head: Normocephalic and atraumatic.     Right Ear: Hearing, tympanic membrane, ear canal and external ear normal.     Left Ear: Hearing, tympanic membrane, ear canal and external ear normal.     Nose: Nose normal.     Mouth/Throat:     Mouth: Mucous membranes are moist.     Pharynx: Oropharynx is clear. Uvula midline. No oropharyngeal exudate.  Eyes:     General: No scleral icterus.       Right eye: No discharge.        Left eye: No discharge.     Extraocular Movements: Extraocular movements intact.     Conjunctiva/sclera: Conjunctivae normal.     Pupils: Pupils are equal, round, and reactive to light.  Neck:     Musculoskeletal: Normal range of motion and neck supple.     Thyroid: No  thyromegaly.     Vascular: No carotid bruit.     Trachea: No tracheal deviation.  Cardiovascular:     Rate and Rhythm: Normal rate and regular rhythm.     Pulses: Normal pulses.     Heart sounds: Normal heart sounds. No murmur. No friction rub. No gallop.   Pulmonary:     Effort: Pulmonary effort is normal. No respiratory distress.     Breath sounds: Normal breath sounds. No stridor. No wheezing or rales.  Chest:     Breasts:  Right: Normal.        Left: Normal.  Abdominal:     General: Bowel sounds are normal.  Musculoskeletal: Normal range of motion.  Lymphadenopathy:     Cervical: No cervical adenopathy.  Skin:    General: Skin is warm and dry.  Neurological:     Mental Status: She is alert and oriented to person, place, and time.  Psychiatric:        Mood and Affect: Mood normal.        Behavior: Behavior normal.        Thought Content: Thought content normal.        Judgment: Judgment normal.      Depression Screen PHQ 2/9 Scores 12/18/2016 08/07/2014  PHQ - 2 Score 0 0  PHQ- 9 Score 0 -       Assessment & Plan:     Routine Health Maintenance and Physical Exam  Exercise Activities and Dietary recommendations Goals   None     Immunization History  Administered Date(s) Administered  . Influenza-Unspecified 11/17/2016    Health Maintenance  Topic Date Due  . HIV Screening  12/24/1983  . TETANUS/TDAP  12/24/1987  . PAP SMEAR-Modifier  02/14/2019  . INFLUENZA VACCINE  Completed     Discussed health benefits of physical activity, and encouraged her to engage in regular exercise appropriate for her age and condition.    1. Annual physical exam Normal physical exam today. Will check labs as below and f/u pending lab results. If labs are stable and WNL she will not need to have these rechecked for one year at her next annual physical exam. She is to call the office in the meantime if she has any acute issue, questions or concerns. - CBC with  Differential/Platelet - Comprehensive metabolic panel - Hemoglobin A1c - Lipid panel - TSH  2. Screening for breast cancer Breast exam today was normal. There is no family history of breast cancer. She does perform regular self breast exams. Mammogram was ordered as below. Information for [facility] clinic was given to patient so she may schedule her mammogram at her convenience. - MM 3D SCREEN BREAST BILATERAL; Future  3. Colon cancer screening Due for colonoscopy this year. Patient willing to try cologuard. Ordered as below.  - Cologuard  4. Family history of diabetes mellitus (DM) Will check labs as below and f/u pending results. - Hemoglobin A1c  5. Hypercholesterolemia Diet controlled. Will check labs as below and f/u pending results. - CBC with Differential/Platelet - Comprehensive metabolic panel - Lipid panel  6. BMI 28.0-28.9,adult Counseled patient on healthy lifestyle modifications including dieting and exercise.  Will start phentermine as below to help with lifestyle modifications.  - CBC with Differential/Platelet - Comprehensive metabolic panel - Hemoglobin A1c - phentermine (ADIPEX-P) 37.5 MG tablet; Take 1 tablet (37.5 mg total) by mouth daily before breakfast.  Dispense: 30 tablet; Refill: 2  7. Meningioma (HCC) Stable. No issues.   8. Encounter for surveillance of contraceptive pills Stable. Diagnosis pulled for medication refill. Continue current medical treatment plan. - norethindrone-ethinyl estradiol (MICROGESTIN,JUNEL,LOESTRIN) 1-20 MG-MCG tablet; Take 1 tablet by mouth daily.  Dispense: 28 tablet; Refill: 1  9. Essential hypertension Stable. Diagnosis pulled for medication refill. Continue current medical treatment plan. Will check labs as below and f/u pending results. - CBC with Differential/Platelet - Comprehensive metabolic panel - amLODipine (NORVASC) 5 MG tablet; Take 1 tablet (5 mg total) by mouth daily.  Dispense: 90 tablet; Refill: 3 -  triamterene-hydrochlorothiazide (  MAXZIDE-25) 37.5-25 MG tablet; Take 1 tablet by mouth daily.  Dispense: 90 tablet; Refill: 3  --------------------------------------------------------------------    Mar Daring, PA-C  [facility]

## 2018-04-23 LAB — COMPREHENSIVE METABOLIC PANEL
ALT: 16 IU/L (ref 0–32)
AST: 17 IU/L (ref 0–40)
Albumin/Globulin Ratio: 1.4 (ref 1.2–2.2)
Albumin: 4 g/dL (ref 3.8–4.8)
Alkaline Phosphatase: 69 IU/L (ref 39–117)
BUN/Creatinine Ratio: 10 (ref 9–23)
BUN: 8 mg/dL (ref 6–24)
Bilirubin Total: 0.4 mg/dL (ref 0.0–1.2)
CO2: 24 mmol/L (ref 20–29)
Calcium: 9.2 mg/dL (ref 8.7–10.2)
Chloride: 102 mmol/L (ref 96–106)
Creatinine, Ser: 0.83 mg/dL (ref 0.57–1.00)
GFR calc non Af Amer: 83 mL/min/{1.73_m2} (ref >59)
GFR, EST AFRICAN AMERICAN: 96 mL/min/{1.73_m2} (ref >59)
GLOBULIN, TOTAL: 2.9 g/dL (ref 1.5–4.5)
Glucose: 82 mg/dL (ref 65–99)
Potassium: 3.6 mmol/L (ref 3.5–5.2)
Sodium: 140 mmol/L (ref 134–144)
Total Protein: 6.9 g/dL (ref 6.0–8.5)

## 2018-04-23 LAB — CBC WITH DIFFERENTIAL/PLATELET
Basophils Absolute: 0 10*3/uL (ref 0.0–0.2)
Basos: 0 %
EOS (ABSOLUTE): 0.2 10*3/uL (ref 0.0–0.4)
EOS: 2 %
HEMATOCRIT: 42.2 % (ref 34.0–46.6)
Hemoglobin: 14.6 g/dL (ref 11.1–15.9)
IMMATURE GRANULOCYTES: 1 %
Immature Grans (Abs): 0.1 10*3/uL (ref 0.0–0.1)
Lymphocytes Absolute: 3.7 10*3/uL — ABNORMAL HIGH (ref 0.7–3.1)
Lymphs: 40 %
MCH: 29.9 pg (ref 26.6–33.0)
MCHC: 34.6 g/dL (ref 31.5–35.7)
MCV: 86 fL (ref 79–97)
MONOS ABS: 0.6 10*3/uL (ref 0.1–0.9)
Monocytes: 7 %
Neutrophils Absolute: 4.6 10*3/uL (ref 1.4–7.0)
Neutrophils: 50 %
Platelets: 328 10*3/uL (ref 150–450)
RBC: 4.89 x10E6/uL (ref 3.77–5.28)
RDW: 11.5 % — AB (ref 11.7–15.4)
WBC: 9.2 10*3/uL (ref 3.4–10.8)

## 2018-04-23 LAB — LIPID PANEL
Chol/HDL Ratio: 4 ratio (ref 0.0–4.4)
Cholesterol, Total: 230 mg/dL — ABNORMAL HIGH (ref 100–199)
HDL: 57 mg/dL (ref >39)
LDL Calculated: 145 mg/dL — ABNORMAL HIGH (ref 0–99)
Triglycerides: 139 mg/dL (ref 0–149)
VLDL CHOLESTEROL CAL: 28 mg/dL (ref 5–40)

## 2018-04-23 LAB — HEMOGLOBIN A1C
Est. average glucose Bld gHb Est-mCnc: 111 mg/dL
Hgb A1c MFr Bld: 5.5 % (ref 4.8–5.6)

## 2018-04-23 LAB — TSH: TSH: 0.779 u[IU]/mL (ref 0.450–4.500)

## 2018-04-25 ENCOUNTER — Telehealth: Payer: Self-pay

## 2018-04-25 NOTE — Telephone Encounter (Signed)
-----   Message from Mar Daring, [place] sent at 04/25/2018  7:35 AM EST ----- Blood count is normal. Kidney and liver function normal. Sugar normal. Cholesterol still borderline high but continues to decrease. This is lowest your cholesterol has been in 3 years! Thyroid normal.

## 2018-04-25 NOTE — Telephone Encounter (Signed)
Viewed by Frankey Shown on 04/25/2018 7:51 AM

## 2018-04-27 ENCOUNTER — Encounter: Payer: Self-pay | Admitting: Physician Assistant

## 2018-05-04 ENCOUNTER — Encounter: Payer: Self-pay | Admitting: Physician Assistant

## 2018-05-04 DIAGNOSIS — N3000 Acute cystitis without hematuria: Secondary | ICD-10-CM

## 2018-05-04 DIAGNOSIS — B3731 Acute candidiasis of vulva and vagina: Secondary | ICD-10-CM

## 2018-05-04 DIAGNOSIS — B373 Candidiasis of vulva and vagina: Secondary | ICD-10-CM

## 2018-05-04 MED ORDER — CEPHALEXIN 500 MG PO CAPS: 500.0000 mg | ORAL_CAPSULE | Freq: Two times a day (BID) | ORAL | 0 refills | Status: DC

## 2018-05-04 MED ORDER — FLUCONAZOLE 150 MG PO TABS: ORAL_TABLET | ORAL | 0 refills | Status: DC

## 2018-05-04 NOTE — Addendum Note (Signed)
Addended by: Mar Daring on: 05/04/2018 11:05 AM   Modules accepted: Orders

## 2018-05-13 ENCOUNTER — Ambulatory Visit
Admission: RE | Admit: 2018-05-13 | Discharge: 2018-05-13 | Disposition: A | Discharge status: Home / Self Care | Payer: 59 | Source: Ambulatory Visit | Attending: Physician Assistant | Admitting: Physician Assistant

## 2018-05-13 DIAGNOSIS — Z1239 Encounter for other screening for malignant neoplasm of breast: Secondary | ICD-10-CM | POA: Diagnosis present

## 2018-05-13 DIAGNOSIS — Z1231 Encounter for screening mammogram for malignant neoplasm of breast: Secondary | ICD-10-CM | POA: Diagnosis not present

## 2018-05-15 DIAGNOSIS — Z1211 Encounter for screening for malignant neoplasm of colon: Secondary | ICD-10-CM | POA: Diagnosis not present

## 2018-05-16 ENCOUNTER — Other Ambulatory Visit: Payer: Self-pay | Admitting: Physician Assistant

## 2018-05-16 DIAGNOSIS — R921 Mammographic calcification found on diagnostic imaging of breast: Secondary | ICD-10-CM

## 2018-05-16 DIAGNOSIS — R928 Other abnormal and inconclusive findings on diagnostic imaging of breast: Secondary | ICD-10-CM

## 2018-05-16 DIAGNOSIS — N6489 Other specified disorders of breast: Secondary | ICD-10-CM

## 2018-05-23 LAB — COLOGUARD: Cologuard: NEGATIVE

## 2018-05-27 ENCOUNTER — Other Ambulatory Visit: Payer: Self-pay

## 2018-05-27 ENCOUNTER — Ambulatory Visit
Admission: RE | Admit: 2018-05-27 | Discharge: 2018-05-27 | Disposition: A | Discharge status: Home / Self Care | Payer: 59 | Source: Ambulatory Visit | Attending: Physician Assistant | Admitting: Physician Assistant

## 2018-05-27 ENCOUNTER — Other Ambulatory Visit: Payer: Self-pay | Admitting: Physician Assistant

## 2018-05-27 DIAGNOSIS — R921 Mammographic calcification found on diagnostic imaging of breast: Secondary | ICD-10-CM | POA: Insufficient documentation

## 2018-05-27 DIAGNOSIS — N6489 Other specified disorders of breast: Secondary | ICD-10-CM

## 2018-05-27 DIAGNOSIS — R928 Other abnormal and inconclusive findings on diagnostic imaging of breast: Secondary | ICD-10-CM

## 2018-05-30 ENCOUNTER — Encounter: Payer: Self-pay | Admitting: Physician Assistant

## 2018-05-30 DIAGNOSIS — Z3041 Encounter for surveillance of contraceptive pills: Secondary | ICD-10-CM

## 2018-05-30 MED ORDER — NORETHINDRONE ACET-ETHINYL EST 1-20 MG-MCG PO TABS: 1.0000 | ORAL_TABLET | Freq: Every day | ORAL | 4 refills | Status: DC

## 2018-06-02 ENCOUNTER — Ambulatory Visit: Payer: 59

## 2018-06-03 ENCOUNTER — Ambulatory Visit
Admission: RE | Admit: 2018-06-03 | Discharge: 2018-06-03 | Disposition: A | Discharge status: Home / Self Care | Payer: 59 | Source: Ambulatory Visit | Attending: Physician Assistant | Admitting: Physician Assistant

## 2018-06-03 ENCOUNTER — Other Ambulatory Visit: Payer: Self-pay

## 2018-06-03 ENCOUNTER — Encounter: Payer: Self-pay | Admitting: Physician Assistant

## 2018-06-03 ENCOUNTER — Other Ambulatory Visit: Payer: Self-pay | Admitting: Physician Assistant

## 2018-06-03 ENCOUNTER — Ambulatory Visit (INDEPENDENT_AMBULATORY_CARE_PROVIDER_SITE_OTHER): Payer: 59 | Admitting: Physician Assistant

## 2018-06-03 VITALS — BP 129/83 | HR 89 | Temp 98.4°F | Resp 16 | Wt 155.4 lb

## 2018-06-03 DIAGNOSIS — N6321 Unspecified lump in the left breast, upper outer quadrant: Secondary | ICD-10-CM | POA: Diagnosis not present

## 2018-06-03 DIAGNOSIS — R920 Mammographic microcalcification found on diagnostic imaging of breast: Secondary | ICD-10-CM | POA: Diagnosis not present

## 2018-06-03 DIAGNOSIS — N3000 Acute cystitis without hematuria: Secondary | ICD-10-CM

## 2018-06-03 DIAGNOSIS — R921 Mammographic calcification found on diagnostic imaging of breast: Secondary | ICD-10-CM | POA: Insufficient documentation

## 2018-06-03 DIAGNOSIS — B3731 Acute candidiasis of vulva and vagina: Secondary | ICD-10-CM

## 2018-06-03 DIAGNOSIS — B373 Candidiasis of vulva and vagina: Secondary | ICD-10-CM

## 2018-06-03 DIAGNOSIS — N6031 Fibrosclerosis of right breast: Secondary | ICD-10-CM | POA: Diagnosis not present

## 2018-06-03 DIAGNOSIS — R300 Dysuria: Secondary | ICD-10-CM

## 2018-06-03 HISTORY — PX: BREAST BIOPSY: SHX20

## 2018-06-03 LAB — POCT URINALYSIS DIPSTICK
Bilirubin, UA: NEGATIVE
GLUCOSE UA: NEGATIVE
Ketones, UA: NEGATIVE
Nitrite, UA: POSITIVE
Protein, UA: NEGATIVE
Spec Grav, UA: 1.01 (ref 1.010–1.025)
Urobilinogen, UA: 0.2 E.U./dL
pH, UA: 8.5 — AB (ref 5.0–8.0)

## 2018-06-03 MED ORDER — FLUCONAZOLE 150 MG PO TABS: ORAL_TABLET | ORAL | 0 refills | Status: DC

## 2018-06-03 MED ORDER — AMOXICILLIN-POT CLAVULANATE 875-125 MG PO TABS: 1.0000 | ORAL_TABLET | Freq: Two times a day (BID) | ORAL | 0 refills | Status: DC

## 2018-06-03 NOTE — Progress Notes (Signed)
Patient: Ashley [name] Female    DOB: 12/31/1970   48 y.o.   MRN: [id number] Visit Date: 06/03/2018  Today's Provider: Mar Daring, PA-C   Chief Complaint  Patient presents with  . Dysuria   Subjective:     Dysuria   This is a recurrent problem. Quality: Pressure. There has been no fever. There is no history of pyelonephritis. Associated symptoms include frequency. Pertinent negatives include no urgency. She has tried antibiotics (Has taken Azo) for the symptoms.  Seen on 04/14/18 for Cystitis and was prescribed Macrobid and Keflex was then sent in on 05/04/18. Urine culture from 04/14/18 was positive for e.coli.   She has been under increased stress due to having an abnormal mammogram and having a biopsy.   Allergies  Allergen Reactions  . Ciprofloxacin Hives  . Codeine Nausea And Vomiting  . Gabapentin Nausea And Vomiting  . Hydrocodone-Acetaminophen Nausea And Vomiting  . Hydromorphone Hcl Nausea And Vomiting  . Oxycodone-Acetaminophen Nausea And Vomiting  . Promethazine Hcl Nausea And Vomiting  . Tramadol Nausea And Vomiting  . Sulfa Antibiotics Rash    hives     Current Outpatient Medications:  .  albuterol (PROVENTIL HFA;VENTOLIN HFA) 108 (90 Base) MCG/ACT inhaler, Inhale 2 puffs into the lungs every 6 (six) hours as needed., Disp: 1 Inhaler, Rfl: 3 .  amLODipine (NORVASC) 5 MG tablet, Take 1 tablet (5 mg total) by mouth daily., Disp: 90 tablet, Rfl: 3 .  fluconazole (DIFLUCAN) 150 MG tablet, Take 1 pill on day 1. Take 1 pill 3 days later if symptoms persist., Disp: 2 tablet, Rfl: 0 .  norethindrone-ethinyl estradiol (MICROGESTIN,JUNEL,LOESTRIN) 1-20 MG-MCG tablet, Take 1 tablet by mouth daily., Disp: 84 tablet, Rfl: 4 .  phentermine (ADIPEX-P) 37.5 MG tablet, Take 1 tablet (37.5 mg total) by mouth daily before breakfast., Disp: 30 tablet, Rfl: 2 .  triamterene-hydrochlorothiazide (MAXZIDE-25) 37.5-25 MG tablet, Take 1 tablet by mouth daily., Disp: 90  tablet, Rfl: 3 .  cephALEXin (KEFLEX) 500 MG capsule, Take 1 capsule (500 mg total) by mouth 2 (two) times daily. (Patient not taking: Reported on 06/03/2018), Disp: 20 capsule, Rfl: 0 .  cyclobenzaprine (FLEXERIL) 5 MG tablet, Take 0.5-1 tablets (2.5-5 mg total) by mouth 3 (three) times daily as needed for muscle spasms. (Patient not taking: Reported on 10/08/2017), Disp: 30 tablet, Rfl: 0 .  montelukast (SINGULAIR) 10 MG tablet, Take 1 tablet (10 mg total) by mouth at bedtime. (Patient not taking: Reported on 10/08/2017), Disp: 30 tablet, Rfl: 3  Review of Systems  Constitutional: Negative.   Respiratory: Negative.   Cardiovascular: Negative.   Gastrointestinal: Negative.   Genitourinary: Positive for dysuria and frequency. Negative for urgency.       Foul odor of urine    Social History   Tobacco Use  . Smoking status: Never Smoker  . Smokeless tobacco: Never Used  Substance Use Topics  . Alcohol use: No    Alcohol/week: 0.0 standard drinks      Objective:   BP 129/83 (BP Location: Left Arm, Patient Position: Sitting, Cuff Size: Large)   Pulse 89   Temp 98.4 F (36.9 C) (Oral)   Resp 16   Wt 155 lb 6.4 oz (70.5 kg)   LMP 05/18/2018   BMI 26.67 kg/m  Vitals:   06/03/18 1012  BP: 129/83  Pulse: 89  Resp: 16  Temp: 98.4 F (36.9 C)  TempSrc: Oral  Weight: 155 lb 6.4 oz (70.5 kg)  Physical Exam Constitutional:      General: She is not in acute distress.    Appearance: Normal appearance. She is well-developed. She is not diaphoretic.  Cardiovascular:     Rate and Rhythm: Normal rate and regular rhythm.     Heart sounds: Normal heart sounds. No murmur. No friction rub. No gallop.   Pulmonary:     Effort: Pulmonary effort is normal. No respiratory distress.     Breath sounds: Normal breath sounds. No wheezing or rales.  Abdominal:     General: Bowel sounds are normal. There is no distension.     Palpations: Abdomen is soft. There is no mass.     Tenderness: There  is no abdominal tenderness. There is no guarding or rebound.  Skin:    General: Skin is warm and dry.  Neurological:     Mental Status: She is alert and oriented to person, place, and time.         Assessment & Plan    1. Dysuria UA positive.  - POCT urinalysis dipstick - Urine Culture  2. Acute cystitis without hematuria Worsening symptoms. UA positive. Will treat empirically with Augmentin as below. Continue to push fluids. Urine sent for culture. Will follow up pending C&S results. She is to call if symptoms do not improve or if they worsen.  - amoxicillin-clavulanate (AUGMENTIN) 875-125 MG tablet; Take 1 tablet by mouth 2 (two) times daily.  Dispense: 20 tablet; Refill: 0  3. Vaginal candida Gets yeast infections with antibiotic use. Diflucan sent as below.  - fluconazole (DIFLUCAN) 150 MG tablet; Take 1 pill on day 1. Take 1 pill 3 days later if symptoms persist.  Dispense: 2 tablet; Refill: 0     Mar Daring, PA-C  [facility]

## 2018-06-03 NOTE — Patient Instructions (Signed)
Urinary Tract Infection, Adult A urinary tract infection (UTI) is an infection of any part of the urinary tract. The urinary tract includes:  The kidneys.  The ureters.  The bladder.  The urethra. These organs make, store, and get rid of pee (urine) in the body. What are the causes? This is caused by germs (bacteria) in your genital area. These germs grow and cause swelling (inflammation) of your urinary tract. What increases the risk? You are more likely to develop this condition if:  You have a small, thin tube (catheter) to drain pee.  You cannot control when you pee or poop (incontinence).  You are female, and: ? You use these methods to prevent pregnancy: ? A medicine that kills sperm (spermicide). ? A device that blocks sperm (diaphragm). ? You have low levels of a female hormone (estrogen). ? You are pregnant.  You have genes that add to your risk.  You are sexually active.  You take antibiotic medicines.  You have trouble peeing because of: ? A prostate that is bigger than normal, if you are female. ? A blockage in the part of your body that drains pee from the bladder (urethra). ? A kidney stone. ? A nerve condition that affects your bladder (neurogenic bladder). ? Not getting enough to drink. ? Not peeing often enough.  You have other conditions, such as: ? Diabetes. ? A weak disease-fighting system (immune system). ? Sickle cell disease. ? Gout. ? Injury of the spine. What are the signs or symptoms? Symptoms of this condition include:  Needing to pee right away (urgently).  Peeing often.  Peeing small amounts often.  Pain or burning when peeing.  Blood in the pee.  Pee that smells bad or not like normal.  Trouble peeing.  Pee that is cloudy.  Fluid coming from the vagina, if you are female.  Pain in the belly or lower back. Other symptoms include:  Throwing up (vomiting).  No urge to eat.  Feeling mixed up (confused).  Being tired  and grouchy (irritable).  A fever.  Watery poop (diarrhea). How is this treated? This condition may be treated with:  Antibiotic medicine.  Other medicines.  Drinking enough water. Follow these instructions at home:  Medicines  Take over-the-counter and prescription medicines only as told by your doctor.  If you were prescribed an antibiotic medicine, take it as told by your doctor. Do not stop taking it even if you start to feel better. General instructions  Make sure you: ? Pee until your bladder is empty. ? Do not hold pee for a long time. ? Empty your bladder after sex. ? Wipe from front to back after pooping if you are a female. Use each tissue one time when you wipe.  Drink enough fluid to keep your pee pale yellow.  Keep all follow-up visits as told by your doctor. This is important. Contact a doctor if:  You do not get better after 1-2 days.  Your symptoms go away and then come back. Get help right away if:  You have very bad back pain.  You have very bad pain in your lower belly.  You have a fever.  You are sick to your stomach (nauseous).  You are throwing up. Summary  A urinary tract infection (UTI) is an infection of any part of the urinary tract.  This condition is caused by germs in your genital area.  There are many risk factors for a UTI. These include having a small, thin   tube to drain pee and not being able to control when you pee or poop.  Treatment includes antibiotic medicines for germs.  Drink enough fluid to keep your pee pale yellow. This information is not intended to replace advice given to you by your health care provider. Make sure you discuss any questions you have with your health care provider. Document Released: 08/12/2007 Document Revised: 09/02/2017 Document Reviewed: 09/02/2017 Elsevier Interactive Patient Education  2019 [employer] Inc[employer]  

## 2018-06-06 ENCOUNTER — Telehealth: Payer: Self-pay

## 2018-06-06 LAB — URINE CULTURE

## 2018-06-06 NOTE — Telephone Encounter (Signed)
-----   Message from Mar Daring, PA-C sent at 06/06/2018  8:16 AM EDT ----- Urine culture was positive for ecoli again. It is sensitive for augmentin. Continue until completed. Call if symptoms return.

## 2018-06-06 NOTE — Telephone Encounter (Signed)
Patient advised. KW

## 2018-06-07 LAB — SURGICAL PATHOLOGY

## 2018-08-09 ENCOUNTER — Encounter: Payer: Self-pay | Admitting: Physician Assistant

## 2018-08-09 DIAGNOSIS — B3731 Acute candidiasis of vulva and vagina: Secondary | ICD-10-CM

## 2018-08-09 DIAGNOSIS — B373 Candidiasis of vulva and vagina: Secondary | ICD-10-CM

## 2018-08-09 DIAGNOSIS — N3000 Acute cystitis without hematuria: Secondary | ICD-10-CM

## 2018-08-09 MED ORDER — FLUCONAZOLE 150 MG PO TABS: ORAL_TABLET | ORAL | 0 refills | Status: DC

## 2018-08-09 MED ORDER — AMOXICILLIN-POT CLAVULANATE 875-125 MG PO TABS: 1.0000 | ORAL_TABLET | Freq: Two times a day (BID) | ORAL | 0 refills | Status: DC

## 2018-08-09 NOTE — Addendum Note (Signed)
Addended by: Mar Daring on: 08/09/2018 02:18 PM   Modules accepted: Orders

## 2018-09-29 ENCOUNTER — Encounter: Payer: Self-pay | Admitting: Physician Assistant

## 2018-09-29 DIAGNOSIS — R928 Other abnormal and inconclusive findings on diagnostic imaging of breast: Secondary | ICD-10-CM

## 2018-10-05 ENCOUNTER — Encounter: Payer: Self-pay | Admitting: Physician Assistant

## 2018-10-05 DIAGNOSIS — B3731 Acute candidiasis of vulva and vagina: Secondary | ICD-10-CM

## 2018-10-05 DIAGNOSIS — B373 Candidiasis of vulva and vagina: Secondary | ICD-10-CM

## 2018-10-05 DIAGNOSIS — N3000 Acute cystitis without hematuria: Secondary | ICD-10-CM

## 2018-10-06 MED ORDER — FLUCONAZOLE 150 MG PO TABS: ORAL_TABLET | ORAL | 0 refills | Status: DC

## 2018-10-06 MED ORDER — AMOXICILLIN-POT CLAVULANATE 875-125 MG PO TABS: 1.0000 | ORAL_TABLET | Freq: Two times a day (BID) | ORAL | 0 refills | Status: DC

## 2018-11-04 ENCOUNTER — Ambulatory Visit
Admission: RE | Admit: 2018-11-04 | Discharge: 2018-11-04 | Disposition: A | Discharge status: Home / Self Care | Payer: 59 | Source: Ambulatory Visit | Attending: Physician Assistant | Admitting: Physician Assistant

## 2018-11-04 DIAGNOSIS — R922 Inconclusive mammogram: Secondary | ICD-10-CM | POA: Diagnosis not present

## 2018-11-04 DIAGNOSIS — R928 Other abnormal and inconclusive findings on diagnostic imaging of breast: Secondary | ICD-10-CM

## 2018-11-25 DIAGNOSIS — H5213 Myopia, bilateral: Secondary | ICD-10-CM | POA: Diagnosis not present

## 2018-12-26 ENCOUNTER — Encounter: Payer: Self-pay | Admitting: Physician Assistant

## 2018-12-26 ENCOUNTER — Telehealth: Payer: Self-pay | Admitting: Physician Assistant

## 2018-12-26 NOTE — Telephone Encounter (Signed)
Pt has an appt for Wed. To see Ashley [name] Sat for some vaginal discharge. Pt was exposed to [place] on Oct 5th.  Was tested on Oct. 10th - negative result. No Symptoms.  Can she still come in for her visit or do we need to make the appt for Wed 21st a virtual?  Please advise.  Thanks, [employer]

## 2018-12-26 NOTE — Telephone Encounter (Signed)
No she is out of 14 day window. She is good to come in

## 2018-12-26 NOTE — Telephone Encounter (Signed)
Patient advised as below.  

## 2018-12-27 NOTE — Progress Notes (Signed)
Patient: Ashley [name] Female    DOB: April 30, 1970   48 y.o.   MRN: [id number] Visit Date: 12/28/2018  Today's Provider: Mar Daring, PA-C   Chief Complaint  Patient presents with  . Vaginal Discharge   Subjective:     Vaginal Discharge The patient's primary symptoms include vaginal discharge. The current episode started more than 1 month ago (Since July or August). The problem has been gradually worsening. She is not pregnant. The vaginal discharge was thin and watery. The vaginal bleeding is spotting. She has not been passing clots. She has been passing tissue (panty liner and tampons because of the discharge). Nothing aggravates the symptoms. She has tried nothing for the symptoms. She is not sexually active.    She reports that she spotted 8/1, 8/24-25 and 10/11-breast sore and cramping. She doesn't get a period because of the birth control she is on.  Allergies  Allergen Reactions  . Ciprofloxacin Hives  . Codeine Nausea And Vomiting  . Gabapentin Nausea And Vomiting  . Hydrocodone-Acetaminophen Nausea And Vomiting  . Hydromorphone Hcl Nausea And Vomiting  . Oxycodone-Acetaminophen Nausea And Vomiting  . Promethazine Hcl Nausea And Vomiting  . Tramadol Nausea And Vomiting  . Sulfa Antibiotics Rash    hives     Current Outpatient Medications:  .  albuterol (PROVENTIL HFA;VENTOLIN HFA) 108 (90 Base) MCG/ACT inhaler, Inhale 2 puffs into the lungs every 6 (six) hours as needed., Disp: 1 Inhaler, Rfl: 3 .  amLODipine (NORVASC) 5 MG tablet, Take 1 tablet (5 mg total) by mouth daily., Disp: 90 tablet, Rfl: 3 .  norethindrone-ethinyl estradiol (MICROGESTIN,JUNEL,LOESTRIN) 1-20 MG-MCG tablet, Take 1 tablet by mouth daily., Disp: 84 tablet, Rfl: 4 .  triamterene-hydrochlorothiazide (MAXZIDE-25) 37.5-25 MG tablet, Take 1 tablet by mouth daily., Disp: 90 tablet, Rfl: 3 .  metroNIDAZOLE (FLAGYL) 500 MG tablet, Take 1 tablet (500 mg total) by mouth 2 (two) times daily.,  Disp: 14 tablet, Rfl: 0  Review of Systems  Constitutional: Negative.   Respiratory: Negative.   Cardiovascular: Negative.   Gastrointestinal: Negative.   Genitourinary: Positive for vaginal discharge.  Neurological: Negative.     Social History   Tobacco Use  . Smoking status: Never Smoker  . Smokeless tobacco: Never Used  Substance Use Topics  . Alcohol use: No    Alcohol/week: 0.0 standard drinks      Objective:   BP 128/87 (BP Location: Left Arm, Patient Position: Sitting, Cuff Size: Large)   Pulse 83   Temp (!) 97.5 F (36.4 C) (Other (Comment))   Resp 16   Wt 163 lb 3.2 oz (74 kg)   BMI 28.01 kg/m  Vitals:   12/28/18 1427  BP: 128/87  Pulse: 83  Resp: 16  Temp: (!) 97.5 F (36.4 C)  TempSrc: Other (Comment)  Weight: 163 lb 3.2 oz (74 kg)  Body mass index is 28.01 kg/m.   Physical Exam Vitals signs reviewed. Exam conducted with a chaperone present.  Constitutional:      General: She is not in acute distress.    Appearance: Normal appearance. She is well-developed and normal weight. She is not ill-appearing or diaphoretic.  Neck:     Musculoskeletal: Normal range of motion and neck supple.     Thyroid: No thyromegaly.     Vascular: No JVD.     Trachea: No tracheal deviation.  Cardiovascular:     Rate and Rhythm: Normal rate and regular rhythm.     Heart sounds: Normal heart  sounds. No murmur. No friction rub. No gallop.   Pulmonary:     Effort: Pulmonary effort is normal. No respiratory distress.     Breath sounds: Normal breath sounds. No wheezing or rales.  Abdominal:     General: Abdomen is flat.     Palpations: Abdomen is soft.     Tenderness: There is no abdominal tenderness.  Genitourinary:    General: Normal vulva.     Exam position: Supine.     Pubic Area: No rash.      Labia:        Right: No rash, tenderness, lesion or injury.        Left: No rash, tenderness, lesion or injury.      Vagina: Vaginal discharge (copious amount of milky  white discharge) present. No erythema or tenderness.     Cervix: No cervical motion tenderness, discharge or erythema.     Uterus: Normal.      Adnexa: Right adnexa normal and left adnexa normal.  Neurological:     Mental Status: She is alert.     No results found for any visits on 12/28/18.     Assessment & Plan    1. Vaginal discharge Vaginal swab collected for vaginal discharge. Suspect BV from recent UTIs. I will f/u pending results. Will Rx metronidazole as below. Call if not improving.  - Cervicovaginal ancillary only - metroNIDAZOLE (FLAGYL) 500 MG tablet; Take 1 tablet (500 mg total) by mouth 2 (two) times daily.  Dispense: 14 tablet; Refill: 0  2. Encounter for Papanicolaou smear for cervical cancer screening Pap collected today. Will send as below and f/u pending results. - Cytology - PAP - metroNIDAZOLE (FLAGYL) 500 MG tablet; Take 1 tablet (500 mg total) by mouth 2 (two) times daily.  Dispense: 14 tablet; Refill: 0  3. BV (bacterial vaginosis) See above medical treatment plan. - metroNIDAZOLE (FLAGYL) 500 MG tablet; Take 1 tablet (500 mg total) by mouth 2 (two) times daily.  Dispense: 14 tablet; Refill: 0     Mar Daring, PA-C  [facility]

## 2018-12-28 ENCOUNTER — Encounter: Payer: Self-pay | Admitting: Physician Assistant

## 2018-12-28 ENCOUNTER — Other Ambulatory Visit (HOSPITAL_COMMUNITY)
Admission: RE | Admit: 2018-12-28 | Discharge: 2018-12-28 | Disposition: A | Discharge status: Home / Self Care | Payer: 59 | Source: Ambulatory Visit | Attending: Physician Assistant | Admitting: Physician Assistant

## 2018-12-28 ENCOUNTER — Other Ambulatory Visit: Payer: Self-pay

## 2018-12-28 ENCOUNTER — Ambulatory Visit (INDEPENDENT_AMBULATORY_CARE_PROVIDER_SITE_OTHER): Payer: 59 | Admitting: Physician Assistant

## 2018-12-28 VITALS — BP 128/87 | HR 83 | Temp 97.5°F | Resp 16 | Wt 163.2 lb

## 2018-12-28 DIAGNOSIS — B9689 Other specified bacterial agents as the cause of diseases classified elsewhere: Secondary | ICD-10-CM

## 2018-12-28 DIAGNOSIS — Z124 Encounter for screening for malignant neoplasm of cervix: Secondary | ICD-10-CM | POA: Insufficient documentation

## 2018-12-28 DIAGNOSIS — N898 Other specified noninflammatory disorders of vagina: Secondary | ICD-10-CM | POA: Insufficient documentation

## 2018-12-28 DIAGNOSIS — N760 Acute vaginitis: Secondary | ICD-10-CM

## 2018-12-28 MED ORDER — METRONIDAZOLE 500 MG PO TABS: 500.0000 mg | ORAL_TABLET | Freq: Two times a day (BID) | ORAL | 0 refills | Status: DC

## 2018-12-28 NOTE — Patient Instructions (Signed)
Bacterial Vaginosis  Bacterial vaginosis is a vaginal infection that occurs when the normal balance of bacteria in the vagina is disrupted. It results from an overgrowth of certain bacteria. This is the most common vaginal infection among women ages 55-44. Because bacterial vaginosis increases your risk for STIs (sexually transmitted infections), getting treated can help reduce your risk for chlamydia, gonorrhea, herpes, and HIV (human immunodeficiency virus). Treatment is also important for preventing complications in pregnant women, because this condition can cause an early (premature) delivery. What are the causes? This condition is caused by an increase in harmful bacteria that are normally present in small amounts in the vagina. However, the reason that the condition develops is not fully understood. What increases the risk? The following factors may make you more likely to develop this condition:  Having a new sexual partner or multiple sexual partners.  Having unprotected sex.  Douching.  Having an intrauterine device (IUD).  Smoking.  Drug and alcohol abuse.  Taking certain antibiotic medicines.  Being pregnant. You cannot get bacterial vaginosis from toilet seats, bedding, swimming pools, or contact with objects around you. What are the signs or symptoms? Symptoms of this condition include:  Grey or white vaginal discharge. The discharge can also be watery or foamy.  A fish-like odor with discharge, especially after sexual intercourse or during menstruation.  Itching in and around the vagina.  Burning or pain with urination. Some women with bacterial vaginosis have no signs or symptoms. How is this diagnosed? This condition is diagnosed based on:  Your medical history.  A physical exam of the vagina.  Testing a sample of vaginal fluid under a microscope to look for a large amount of bad bacteria or abnormal cells. Your health care provider may use a cotton swab or  a small wooden spatula to collect the sample. How is this treated? This condition is treated with antibiotics. These may be given as a pill, a vaginal cream, or a medicine that is put into the vagina (suppository). If the condition comes back after treatment, a second round of antibiotics may be needed. Follow these instructions at home: Medicines  Take over-the-counter and prescription medicines only as told by your health care provider.  Take or use your antibiotic as told by your health care provider. Do not stop taking or using the antibiotic even if you start to feel better. General instructions  If you have a female sexual partner, tell her that you have a vaginal infection. She should see her health care provider and be treated if she has symptoms. If you have a female sexual partner, he does not need treatment.  During treatment: ? Avoid sexual activity until you finish treatment. ? Do not douche. ? Avoid alcohol as directed by your health care provider. ? Avoid breastfeeding as directed by your health care provider.  Drink enough water and fluids to keep your urine clear or pale yellow.  Keep the area around your vagina and rectum clean. ? Wash the area daily with warm water. ? Wipe yourself from front to back after using the toilet.  Keep all follow-up visits as told by your health care provider. This is important. How is this prevented?  Do not douche.  Wash the outside of your vagina with warm water only.  Use protection when having sex. This includes latex condoms and dental dams.  Limit how many sexual partners you have. To help prevent bacterial vaginosis, it is best to have sex with just one partner (  monogamous).  Make sure you and your sexual partner are tested for STIs.  Wear cotton or cotton-lined underwear.  Avoid wearing tight pants and pantyhose, especially during summer.  Limit the amount of alcohol that you drink.  Do not use any products that contain  nicotine or tobacco, such as cigarettes and e-cigarettes. If you need help quitting, ask your health care provider.  Do not use illegal drugs. Where to find more information  Centers for Disease Control and Prevention: AppraiserFraud.fi  American Sexual Health Association (ASHA): www.ashastd.org  U.S. Department of Health and Financial controller, Office on Women's Health: DustingSprays.pl or SecuritiesCard.it Contact a health care provider if:  Your symptoms do not improve, even after treatment.  You have more discharge or pain when urinating.  You have a fever.  You have pain in your abdomen.  You have pain during sex.  You have vaginal bleeding between periods. Summary  Bacterial vaginosis is a vaginal infection that occurs when the normal balance of bacteria in the vagina is disrupted.  Because bacterial vaginosis increases your risk for STIs (sexually transmitted infections), getting treated can help reduce your risk for chlamydia, gonorrhea, herpes, and HIV (human immunodeficiency virus). Treatment is also important for preventing complications in pregnant women, because the condition can cause an early (premature) delivery.  This condition is treated with antibiotic medicines. These may be given as a pill, a vaginal cream, or a medicine that is put into the vagina (suppository). This information is not intended to replace advice given to you by your health care provider. Make sure you discuss any questions you have with your health care provider. Document Released: 02/23/2005 Document Revised: 02/05/2017 Document Reviewed: 11/09/2015 Elsevier Patient Education  2020 [employer].

## 2019-01-02 LAB — CYTOLOGY - PAP
Adequacy: ABSENT
Comment: NEGATIVE
Diagnosis: NEGATIVE
High risk HPV: NEGATIVE

## 2019-01-04 ENCOUNTER — Telehealth: Payer: Self-pay

## 2019-01-04 NOTE — Telephone Encounter (Signed)
-----   Message from Mar Daring, [place] sent at 01/03/2019  5:45 PM EDT ----- Pap is normal, HPV negative.  Will repeat in 5 years. Trichomonas was present on the pap. The swab we did has still not completely resulted. Trichomonas is a sexually transmitted disease. It is treated with metronidazole as you were placed on for suspected bacterial vaginosis. I am not sure if BV is present or not since the swab has still not resulted. It is recommended to not have unprotected intercourse until the partner is treated as well to prevent spread of the STI.

## 2019-01-04 NOTE — Telephone Encounter (Signed)
Patient was advised as below. KW

## 2019-01-04 NOTE — Telephone Encounter (Signed)
Patient was advised but she states that she has not had sexually intercourse since May of this year, she states that vaginal discharge wasn't present until July and states that she has completed Metronidazole and discharge is now cleared. Patient wants to know since she has not been sexually active if she has to get retested again?KW

## 2019-01-04 NOTE — Telephone Encounter (Signed)
No she does not and trichomonas can lie dormant unfortunately. Normally not longer than 28 days, but some rare cases of 3-6 months of it lying dormant.

## 2019-01-05 ENCOUNTER — Telehealth: Payer: Self-pay

## 2019-01-05 LAB — CERVICOVAGINAL ANCILLARY ONLY
Bacterial Vaginitis (gardnerella): POSITIVE — AB
Candida Glabrata: NEGATIVE
Candida Vaginitis: NEGATIVE
Chlamydia: NEGATIVE
Comment: NEGATIVE
Comment: NEGATIVE
Comment: NEGATIVE
Comment: NEGATIVE
Comment: NEGATIVE
Comment: NORMAL
Neisseria Gonorrhea: NEGATIVE
Trichomonas: POSITIVE — AB

## 2019-01-05 NOTE — Telephone Encounter (Signed)
-----   Message from Mar Daring, [place] sent at 01/05/2019 10:19 AM EDT ----- The vaginal swab was positive for BV and trichomonas. Both are treated the same way with metronidazole. As of Tuesday the discharge had cleared from treatment which is great. Please call if returns.

## 2019-01-05 NOTE — Telephone Encounter (Signed)
Patient advised as directed below.

## 2019-01-08 ENCOUNTER — Encounter (INDEPENDENT_AMBULATORY_CARE_PROVIDER_SITE_OTHER): Payer: 59 | Admitting: Physician Assistant

## 2019-01-08 DIAGNOSIS — B9689 Other specified bacterial agents as the cause of diseases classified elsewhere: Secondary | ICD-10-CM

## 2019-01-08 DIAGNOSIS — N760 Acute vaginitis: Secondary | ICD-10-CM | POA: Diagnosis not present

## 2019-01-08 DIAGNOSIS — A599 Trichomoniasis, unspecified: Secondary | ICD-10-CM

## 2019-01-09 ENCOUNTER — Telehealth: Payer: Self-pay | Admitting: Physician Assistant

## 2019-01-09 DIAGNOSIS — N898 Other specified noninflammatory disorders of vagina: Secondary | ICD-10-CM

## 2019-01-09 DIAGNOSIS — B9689 Other specified bacterial agents as the cause of diseases classified elsewhere: Secondary | ICD-10-CM

## 2019-01-09 DIAGNOSIS — N760 Acute vaginitis: Secondary | ICD-10-CM

## 2019-01-09 DIAGNOSIS — Z124 Encounter for screening for malignant neoplasm of cervix: Secondary | ICD-10-CM

## 2019-01-09 MED ORDER — METRONIDAZOLE 500 MG PO TABS: 500.0000 mg | ORAL_TABLET | Freq: Two times a day (BID) | ORAL | 0 refills | Status: DC

## 2019-01-09 NOTE — Telephone Encounter (Signed)
@  LOGO@  Subjective:   Patient ID: Ashley [name], female    DOB: Apr 08, 1970, 48 y.o.   MRN: [id number]  Ashley [name] is a 48 y.o. female presenting on 01/08/2019 for Vaginal Discharge.   Virtual Visit via Telephone Note  I connected with Ashley [name] on @TODAY @ at  by telephone and verified that I am speaking with the correct person using two identifiers.   I discussed the limitations, risks, security and privacy concerns of performing an evaluation and management service by telephone and the availability of in person appointments. I also discussed with the patient that there may be a patient responsible charge related to this service. The patient expressed understanding and agreed to proceed.  Patient location: home Provider location: [place] office  Persons involved in the visit: patient, provider   HPI  Patient presenting today for follow up of vaginal discharge. Patient was seen in clinic on 12/28/2018 and vaginitis swab was positive for bacterial vaginosis and trichomonas. She was treated with metronidazole 500 mg BID x 7 days. She has completed treatment and now is reporting some vaginal discharge. No new sexual partners. No fevers, chills, pelvic pain.   Social History   Tobacco Use  . Smoking status: Never Smoker  . Smokeless tobacco: Never Used  Substance Use Topics  . Alcohol use: No    Alcohol/week: 0.0 standard drinks  . Drug use: Not on file    ROS Per HPI unless specifically indicated above   Objective:   Wt Readings from Last 3 Encounters:  12/28/18 163 lb 3.2 oz (74 kg)  06/03/18 155 lb 6.4 oz (70.5 kg)  04/22/18 167 lb (75.8 kg)    Physical Exam Results for orders placed or performed in visit on 12/28/18  Cervicovaginal ancillary only  Result Value Ref Range   Neisseria Gonorrhea Negative    Chlamydia Negative    Trichomonas Positive (A)    Bacterial Vaginitis (gardnerella) Positive (A)    Candida Vaginitis Negative    Candida Glabrata Negative    Comment      Normal Reference Range Bacterial Vaginosis - Negative   Comment Normal Reference Range Candida Species - Negative    Comment Normal Reference Range Candida Galbrata - Negative    Comment Normal Reference Range Trichomonas - Negative    Comment Normal Reference Ranger Chlamydia - Negative    Comment      Normal Reference Range Neisseria Gonorrhea - Negative  Cytology - PAP  Result Value Ref Range   High risk HPV Negative    Adequacy      Satisfactory for evaluation; transformation zone component ABSENT.   Diagnosis      - Negative for intraepithelial lesion or malignancy (NILM)   Microorganisms Trichomonas vaginalis present    Comment Normal Reference Range HPV - Negative     Assessment & Plan:  1. Bacterial vaginitis  Will send in 7 days of metronidazole 500 mg BID.  2. Trichomonas infection    Follow up plan: Return to clinic for recheck if symptoms not improving.   Carles Collet, PA-C [facility] 01/09/2019, 3:38 PM

## 2019-01-09 NOTE — Telephone Encounter (Signed)
Medication send into pharmacy.

## 2019-01-09 NOTE — Telephone Encounter (Signed)
Can we please call in metronidazole 500 mg BID x 7 days to pharmacy? My computer has an error and won't let me send it in.

## 2019-01-09 NOTE — Addendum Note (Signed)
Addended by: Casimer Leek C on: 01/09/2019 05:01 PM   Modules accepted: Orders

## 2019-03-21 ENCOUNTER — Other Ambulatory Visit: Payer: Self-pay | Admitting: Physician Assistant

## 2019-03-21 DIAGNOSIS — R921 Mammographic calcification found on diagnostic imaging of breast: Secondary | ICD-10-CM

## 2019-04-28 ENCOUNTER — Ambulatory Visit (INDEPENDENT_AMBULATORY_CARE_PROVIDER_SITE_OTHER): Payer: No Typology Code available for payment source | Admitting: Physician Assistant

## 2019-04-28 ENCOUNTER — Encounter: Payer: Self-pay | Admitting: Physician Assistant

## 2019-04-28 ENCOUNTER — Other Ambulatory Visit: Payer: Self-pay | Admitting: Physician Assistant

## 2019-04-28 VITALS — Wt 167.0 lb

## 2019-04-28 DIAGNOSIS — Z3041 Encounter for surveillance of contraceptive pills: Secondary | ICD-10-CM

## 2019-04-28 DIAGNOSIS — D329 Benign neoplasm of meninges, unspecified: Secondary | ICD-10-CM | POA: Diagnosis not present

## 2019-04-28 DIAGNOSIS — Z6828 Body mass index (BMI) 28.0-28.9, adult: Secondary | ICD-10-CM

## 2019-04-28 DIAGNOSIS — I10 Essential (primary) hypertension: Secondary | ICD-10-CM | POA: Diagnosis not present

## 2019-04-28 DIAGNOSIS — J45990 Exercise induced bronchospasm: Secondary | ICD-10-CM

## 2019-04-28 DIAGNOSIS — R202 Paresthesia of skin: Secondary | ICD-10-CM

## 2019-04-28 DIAGNOSIS — L814 Other melanin hyperpigmentation: Secondary | ICD-10-CM

## 2019-04-28 MED ORDER — ALBUTEROL SULFATE HFA 108 (90 BASE) MCG/ACT IN AERS: INHALATION_SPRAY | RESPIRATORY_TRACT | 0 refills | Status: DC

## 2019-04-28 MED ORDER — PHENTERMINE HCL 37.5 MG PO TABS: 37.5000 mg | ORAL_TABLET | Freq: Every day | ORAL | 0 refills | Status: DC

## 2019-04-28 MED ORDER — TRIAMTERENE-HCTZ 37.5-25 MG PO TABS: 1.0000 | ORAL_TABLET | Freq: Every day | ORAL | 3 refills | Status: DC

## 2019-04-28 MED ORDER — NORETHINDRONE ACET-ETHINYL EST 1-20 MG-MCG PO TABS: 1.0000 | ORAL_TABLET | Freq: Every day | ORAL | 4 refills | Status: DC

## 2019-04-28 MED ORDER — AMLODIPINE BESYLATE 5 MG PO TABS: 5.0000 mg | ORAL_TABLET | Freq: Every day | ORAL | 3 refills | Status: DC

## 2019-04-28 NOTE — Progress Notes (Deleted)
Patient: Ashley [name], Female    DOB: [birth year], 49 y.o.   MRN: [id number] Visit Date: 04/28/2019  Today's Provider: Mar Daring, PA-C   No chief complaint on file.  Subjective:     Annual physical exam Ashley [name] is a 49 y.o. female who presents today for health maintenance and complete physical. She feels {DESC; WELL/FAIRLY WELL/POORLY:18703}. She reports exercising ***. She reports she is sleeping {DESC; WELL/FAIRLY WELL/POORLY:18703}.  ----------------------------------------------------------------- 01/03/19-Pap is normal, HPV negative. Will repeat in 5 years 05/19/2019-Mammogram scheduled.  Review of Systems  Constitutional: Negative.   HENT: Negative.   Eyes: Negative.   Respiratory: Negative.   Cardiovascular: Negative.   Gastrointestinal: Negative.   Endocrine: Negative.   Genitourinary: Negative.   Musculoskeletal: Negative.   Skin: Negative.   Allergic/Immunologic: Negative.   Neurological: Negative.   Hematological: Negative.   Psychiatric/Behavioral: Negative.     Social History      She  reports that she has never smoked. She has never used smokeless tobacco. She reports that she does not drink alcohol.       Social History   Socioeconomic History  . Marital status: Single    Spouse name: Not on file  . Number of children: Not on file  . Years of education: Not on file  . Highest education level: Not on file  Occupational History  . Not on file  Tobacco Use  . Smoking status: Never Smoker  . Smokeless tobacco: Never Used  Substance and Sexual Activity  . Alcohol use: No    Alcohol/week: 0.0 standard drinks  . Drug use: Not on file  . Sexual activity: Not on file  Other Topics Concern  . Not on file  Social History Narrative  . Not on file   Social Determinants of Health   Financial Resource Strain:   . Difficulty of Paying Living Expenses: Not on file  Food Insecurity:   . Worried About Charity fundraiser in the  Last Year: Not on file  . Ran Out of Food in the Last Year: Not on file  Transportation Needs:   . Lack of Transportation (Medical): Not on file  . Lack of Transportation (Non-Medical): Not on file  Physical Activity:   . Days of Exercise per Week: Not on file  . Minutes of Exercise per Session: Not on file  Stress:   . Feeling of Stress : Not on file  Social Connections:   . Frequency of Communication with Friends and Family: Not on file  . Frequency of Social Gatherings with Friends and Family: Not on file  . Attends Religious Services: Not on file  . Active Member of Clubs or Organizations: Not on file  . Attends Archivist Meetings: Not on file  . Marital Status: Not on file    No past medical history on file.   Patient Active Problem List   Diagnosis Date Noted  . Meningioma ([place]) 04/22/2018  . Fatigue 10/25/2015  . Hypercholesterolemia 10/25/2015  . BP (high blood pressure) 10/25/2015  . Acute onset aura migraine 09/14/2008  . 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 Allergic rhinitis 07/05/2007    Past Surgical History:  Procedure Laterality Date  . BREAST BIOPSY Right 06/03/2018   X clip stereo bx CALCIUM OXALATE CRYSTALS WITHIN APOCRINE MICROCYSTS USUAL DUCTAL HYPERPLASIA, COLUMNAR CELL CHANGE, AND FOCAL PSEUDOANGIOMATOUS STROMAL HYPERPLASIA ([place]  . BREAST CYST ASPIRATION Right 10+ yrs ago    Family History  No family status information on file.        Her family history is negative for Breast cancer.      Allergies  Allergen Reactions  . Ciprofloxacin Hives  . Codeine Nausea And Vomiting  . Gabapentin Nausea And Vomiting  . Hydrocodone-Acetaminophen Nausea And Vomiting  . Hydromorphone Hcl Nausea And Vomiting  . Oxycodone-Acetaminophen Nausea And Vomiting  . Promethazine Hcl Nausea And Vomiting  . Tramadol Nausea And Vomiting  . Sulfa Antibiotics Rash    hives     Current Outpatient Medications:  .  albuterol (PROVENTIL  HFA;VENTOLIN HFA) 108 (90 Base) MCG/ACT inhaler, Inhale 2 puffs into the lungs every 6 (six) hours as needed., Disp: 1 Inhaler, Rfl: 3 .  amLODipine (NORVASC) 5 MG tablet, Take 1 tablet (5 mg total) by mouth daily., Disp: 90 tablet, Rfl: 3 .  metroNIDAZOLE (FLAGYL) 500 MG tablet, Take 1 tablet (500 mg total) by mouth 2 (two) times daily., Disp: 14 tablet, Rfl: 0 .  norethindrone-ethinyl estradiol (MICROGESTIN,JUNEL,LOESTRIN) 1-20 MG-MCG tablet, Take 1 tablet by mouth daily., Disp: 84 tablet, Rfl: 4 .  triamterene-hydrochlorothiazide (MAXZIDE-25) 37.5-25 MG tablet, Take 1 tablet by mouth daily., Disp: 90 tablet, Rfl: 3   Patient Care Team: Mar Daring, PA-C as PCP - General (Family Medicine)    Objective:    Vitals: There were no vitals taken for this visit.  There were no vitals filed for this visit.   Physical Exam   Depression Screen PHQ 2/9 Scores 04/22/2018 12/18/2016 08/07/2014  PHQ - 2 Score 0 0 0  PHQ- 9 Score - 0 -       Assessment & Plan:     Routine Health Maintenance and Physical Exam  Exercise Activities and Dietary recommendations Goals   None     Immunization History  Administered Date(s) Administered  . Influenza-Unspecified 11/17/2016, 12/22/2017    Health Maintenance  Topic Date Due  . HIV Screening  12/24/1983  . TETANUS/TDAP  12/24/1987  . INFLUENZA VACCINE  06/07/2019 (Originally 10/08/2018)  . MAMMOGRAM  05/12/2020  . Fecal DNA (Cologuard)  05/14/2021  . PAP SMEAR-Modifier  12/27/2021     Discussed health benefits of physical activity, and encouraged her to engage in regular exercise appropriate for her age and condition.    --------------------------------------------------------------------    Mar Daring, PA-C  [place]

## 2019-04-28 NOTE — Progress Notes (Signed)
Patient: Ashley [name] Female    DOB: 11/14/1970   49 y.o.   MRN: [id number] Visit Date: 04/28/2019  Today's Provider: Mar Daring, PA-C   Chief Complaint  Patient presents with  . Obesity  . Medication Refill   Subjective:     Virtual Visit via Telephone Note  I connected with Ashley [name] on 04/28/19 at 10:00 AM EST by telephone and verified that I am speaking with the correct person using two identifiers.  Location: Patient: Home Provider: [facility]   I discussed the limitations, risks, security and privacy concerns of performing an evaluation and management service by telephone and the availability of in person appointments. I also discussed with the patient that there may be a patient responsible charge related to this service. The patient expressed understanding and agreed to proceed.   HPI  Patient needs refills on her medication and she would also like to get back on Phentermine. She is coming in for her CPE on 05/19/19 now due to a scheduling error through the [facility].   She does also mention having some tingling and numbness sensations in her toes on both feet and noticing a more darkening skin color of her lower extremities bilaterally. She denies any occult leg swelling.   Wt Readings from Last 3 Encounters:  04/28/19 167 lb (75.8 kg)  12/28/18 163 lb 3.2 oz (74 kg)  06/03/18 155 lb 6.4 oz (70.5 kg)    Allergies  Allergen Reactions  . Ciprofloxacin Hives  . Codeine Nausea And Vomiting  . Gabapentin Nausea And Vomiting  . Hydrocodone-Acetaminophen Nausea And Vomiting  . Hydromorphone Hcl Nausea And Vomiting  . Oxycodone-Acetaminophen Nausea And Vomiting  . Promethazine Hcl Nausea And Vomiting  . Tramadol Nausea And Vomiting  . Sulfa Antibiotics Rash    hives     Current Outpatient Medications:  .  albuterol (PROVENTIL HFA;VENTOLIN HFA) 108 (90 Base) MCG/ACT inhaler, Inhale 2 puffs into the lungs every 6 (six) hours as needed., Disp: 1 Inhaler,  Rfl: 3 .  amLODipine (NORVASC) 5 MG tablet, Take 1 tablet (5 mg total) by mouth daily., Disp: 90 tablet, Rfl: 3 .  norethindrone-ethinyl estradiol (MICROGESTIN,JUNEL,LOESTRIN) 1-20 MG-MCG tablet, Take 1 tablet by mouth daily., Disp: 84 tablet, Rfl: 4 .  triamterene-hydrochlorothiazide (MAXZIDE-25) 37.5-25 MG tablet, Take 1 tablet by mouth daily., Disp: 90 tablet, Rfl: 3  Review of Systems  Constitutional: Negative.   Respiratory: Negative.   Cardiovascular: Negative.   Gastrointestinal: Negative.   Musculoskeletal: Negative.   Skin: Positive for color change.  Neurological: Positive for numbness.    Social History   Tobacco Use  . Smoking status: Never Smoker  . Smokeless tobacco: Never Used  Substance Use Topics  . Alcohol use: No    Alcohol/week: 0.0 standard drinks      Objective:   Wt 167 lb (75.8 kg)   BMI 28.67 kg/m  Vitals:   04/28/19 0956  Weight: 167 lb (75.8 kg)  Body mass index is 28.67 kg/m.   Physical Exam Vitals reviewed.  Constitutional:      General: She is not in acute distress. Pulmonary:     Effort: No respiratory distress.  Neurological:     Mental Status: She is alert.      No results found for any visits on 04/28/19.     Assessment & Plan     1. Essential hypertension Stable. Diagnosis pulled for medication refill. Continue current medical treatment plan.  - amLODipine (NORVASC) 5  MG tablet; Take 1 tablet (5 mg total) by mouth daily.  Dispense: 90 tablet; Refill: 3 - triamterene-hydrochlorothiazide (MAXZIDE-25) 37.5-25 MG tablet; Take 1 tablet by mouth daily.  Dispense: 90 tablet; Refill: 3  2. Meningioma (HCC) Stable, benign. Was evaluated by Neuro in 2018.   3. Exercise-induced asthma Stable. Diagnosis pulled for medication refill. Continue current medical treatment plan. - albuterol (VENTOLIN HFA) 108 (90 Base) MCG/ACT inhaler; Use prior to exercise  Dispense: 18 g; Refill: 0  4. Encounter for surveillance of contraceptive  pills Stable. Diagnosis pulled for medication refill. Continue current medical treatment plan. - norethindrone-ethinyl estradiol (LOESTRIN) 1-20 MG-MCG tablet; Take 1 tablet by mouth daily.  Dispense: 84 tablet; Refill: 4  5. BMI 28.0-28.9,adult Restarting phentermine as below. Patient has done well in the past with this. Will recheck weight when she returns on 05/19/19.  - phentermine (ADIPEX-P) 37.5 MG tablet; Take 1 tablet (37.5 mg total) by mouth daily before breakfast.  Dispense: 30 tablet; Refill: 0  6. Paresthesia of both feet Noted by patient. Unable to evaluate due to being a virtual visit. With describing the darkening of skin along with the parasthesia, I question possible venous insuff. Advised to start using compression stockings more regularly, especially while at work. Also elevating legs when she can. Referral placed as below to vein clinic.  - Ambulatory referral to Vascular Surgery  7. Darkening of skin See above medical treatment plan. - Ambulatory referral to Vascular Surgery   I discussed the assessment and treatment plan with the patient. The patient was provided an opportunity to ask questions and all were answered. The patient agreed with the plan and demonstrated an understanding of the instructions.   The patient was advised to call back or seek an in-person evaluation if the symptoms worsen or if the condition fails to improve as anticipated.  I provided 14 minutes of non-face-to-face time during this encounter.    Mar Daring, PA-C  [facility]

## 2019-04-28 NOTE — Patient Instructions (Signed)

## 2019-05-18 NOTE — Progress Notes (Signed)
Patient: Ashley [name], Female    DOB: Feb 15, 1971, 49 y.o.   MRN: [id number] Visit Date: 05/19/2019  Today's Provider: Mar Daring, PA-C   Chief Complaint  Patient presents with  . Annual Exam   Subjective:     Annual physical exam Ashley [name] is a 49 y.o. female who presents today for health maintenance and complete physical. She feels well. She reports exercising. She reports she is sleeping fairly well.  ----------------------------------------------------------------- 01/03/19-Pap is normal, HPV negative. Will repeat in 5 years Mammogram and [place] scheduled.  Review of Systems  Constitutional: Positive for fatigue.  HENT: Negative.   Eyes: Negative.   Respiratory: Negative.   Cardiovascular: Negative.   Gastrointestinal: Negative.   Endocrine: Negative for polyphagia.  Genitourinary: Negative.   Musculoskeletal: Negative.   Skin: Negative.   Allergic/Immunologic: Negative.   Neurological: Negative.   Hematological: Negative.   Psychiatric/Behavioral: Positive for sleep disturbance.    Social History      She  reports that she has never smoked. She has never used smokeless tobacco. She reports that she does not drink alcohol.       Social History   Socioeconomic History  . Marital status: Single    Spouse name: Not on file  . Number of children: Not on file  . Years of education: Not on file  . Highest education level: Not on file  Occupational History  . Not on file  Tobacco Use  . Smoking status: Never Smoker  . Smokeless tobacco: Never Used  Substance and Sexual Activity  . Alcohol use: No    Alcohol/week: 0.0 standard drinks  . Drug use: Not on file  . Sexual activity: Not on file  Other Topics Concern  . Not on file  Social History Narrative  . Not on file   Social Determinants of Health   Financial Resource Strain:   . Difficulty of Paying Living Expenses:   Food Insecurity:   . Worried About Charity fundraiser in the  Last Year:   . Arboriculturist in the Last Year:   Transportation Needs:   . Film/video editor (Medical):   Marland Kitchen Lack of Transportation (Non-Medical):   Physical Activity:   . Days of Exercise per Week:   . Minutes of Exercise per Session:   Stress:   . Feeling of Stress :   Social Connections:   . Frequency of Communication with Friends and Family:   . Frequency of Social Gatherings with Friends and Family:   . Attends Religious Services:   . Active Member of Clubs or Organizations:   . Attends Archivist Meetings:   Marland Kitchen Marital Status:     History reviewed. No pertinent past medical history.   Patient Active Problem List   Diagnosis Date Noted  . Meningioma ([place]) 04/22/2018  . Fatigue 10/25/2015  . Hypercholesterolemia 10/25/2015  . BP (high blood pressure) 10/25/2015  . Acute onset aura migraine 09/14/2008  . 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 Allergic rhinitis 07/05/2007    Past Surgical History:  Procedure Laterality Date  . BREAST BIOPSY Right 06/03/2018   X clip stereo bx CALCIUM OXALATE CRYSTALS WITHIN APOCRINE MICROCYSTS USUAL DUCTAL HYPERPLASIA, COLUMNAR CELL CHANGE, AND FOCAL PSEUDOANGIOMATOUS STROMAL HYPERPLASIA ([place]  . BREAST CYST ASPIRATION Right 10+ yrs ago    Family History        No family status information on file.        Her family history  is negative for Breast cancer.      Allergies  Allergen Reactions  . Ciprofloxacin Hives  . Codeine Nausea And Vomiting  . Gabapentin Nausea And Vomiting  . Hydrocodone-Acetaminophen Nausea And Vomiting  . Hydromorphone Hcl Nausea And Vomiting  . Oxycodone-Acetaminophen Nausea And Vomiting  . Promethazine Hcl Nausea And Vomiting  . Tramadol Nausea And Vomiting  . Sulfa Antibiotics Rash    hives     Current Outpatient Medications:  .  albuterol (VENTOLIN HFA) 108 (90 Base) MCG/ACT inhaler, Use prior to exercise, Disp: 18 g, Rfl: 0 .  amLODipine (NORVASC) 5 MG tablet, Take 1 tablet  (5 mg total) by mouth daily., Disp: 90 tablet, Rfl: 3 .  norethindrone-ethinyl estradiol (LOESTRIN) 1-20 MG-MCG tablet, Take 1 tablet by mouth daily., Disp: 84 tablet, Rfl: 4 .  phentermine (ADIPEX-P) 37.5 MG tablet, Take 1 tablet (37.5 mg total) by mouth daily before breakfast., Disp: 30 tablet, Rfl: 0 .  triamterene-hydrochlorothiazide (MAXZIDE-25) 37.5-25 MG tablet, Take 1 tablet by mouth daily., Disp: 90 tablet, Rfl: 3   Patient Care Team: Mar Daring, PA-C as PCP - General (Family Medicine)    Objective:    Vitals: BP 130/88 (BP Location: Left Arm, Patient Position: Sitting, Cuff Size: Large)   Pulse (!) 103   Temp (!) 97.3 F (36.3 C) (Temporal)   Resp 16   Ht 5' 4"  (1.626 m)   Wt 163 lb 6.4 oz (74.1 kg)   BMI 28.05 kg/m    Vitals:   05/19/19 0738  BP: 130/88  Pulse: (!) 103  Resp: 16  Temp: (!) 97.3 F (36.3 C)  TempSrc: Temporal  Weight: 163 lb 6.4 oz (74.1 kg)  Height: 5' 4"  (1.626 m)     Physical Exam Vitals reviewed.  Constitutional:      General: She is not in acute distress.    Appearance: Normal appearance. She is well-developed and overweight. She is not diaphoretic.  HENT:     Head: Normocephalic and atraumatic.     Right Ear: Tympanic membrane, ear canal and external ear normal.     Left Ear: Tympanic membrane, ear canal and external ear normal.     Nose: Nose normal.     Mouth/Throat:     Mouth: Mucous membranes are moist.     Pharynx: Oropharynx is clear. No oropharyngeal exudate or posterior oropharyngeal erythema.  Eyes:     General: No scleral icterus.       Right eye: No discharge.        Left eye: No discharge.     Extraocular Movements: Extraocular movements intact.     Conjunctiva/sclera: Conjunctivae normal.     Pupils: Pupils are equal, round, and reactive to light.  Neck:     Thyroid: No thyromegaly.     Vascular: No JVD.     Trachea: No tracheal deviation.  Cardiovascular:     Rate and Rhythm: Normal rate and regular  rhythm.     Pulses: Normal pulses.     Heart sounds: Normal heart sounds. No murmur. No friction rub. No gallop.   Pulmonary:     Effort: Pulmonary effort is normal. No respiratory distress.     Breath sounds: Normal breath sounds. No wheezing or rales.  Chest:     Chest wall: No tenderness.  Abdominal:     General: Abdomen is flat. Bowel sounds are normal. There is no distension.     Palpations: Abdomen is soft. There is no mass.  Tenderness: There is no abdominal tenderness. There is no guarding or rebound.  Musculoskeletal:        General: No tenderness. Normal range of motion.     Cervical back: Normal range of motion and neck supple.     Right lower leg: No edema.     Left lower leg: No edema.  Lymphadenopathy:     Cervical: No cervical adenopathy.  Skin:    General: Skin is warm and dry.     Capillary Refill: Capillary refill takes less than 2 seconds.     Findings: No rash.  Neurological:     General: No focal deficit present.     Mental Status: She is alert and oriented to person, place, and time. Mental status is at baseline.  Psychiatric:        Mood and Affect: Mood normal.        Behavior: Behavior normal. Behavior is cooperative.        Thought Content: Thought content normal.        Judgment: Judgment normal.      Depression Screen PHQ 2/9 Scores 05/19/2019 04/28/2019 04/22/2018 12/18/2016  PHQ - 2 Score 0 0 0 0  PHQ- 9 Score - - - 0       Assessment & Plan:     Routine Health Maintenance and Physical Exam  Exercise Activities and Dietary recommendations Goals   None     Immunization History  Administered Date(s) Administered  . Influenza-Unspecified 11/17/2016, 12/22/2017    Health Maintenance  Topic Date Due  . HIV Screening  Never done  . TETANUS/TDAP  Never done  . INFLUENZA VACCINE  06/07/2019 (Originally 10/08/2018)  . MAMMOGRAM  05/12/2020  . Fecal DNA (Cologuard)  05/14/2021  . PAP SMEAR-Modifier  12/27/2021     Discussed  health benefits of physical activity, and encouraged her to engage in regular exercise appropriate for her age and condition.    1. Annual physical exam Normal physical exam today. Will check labs as below and f/u pending lab results. If labs are stable and WNL she will not need to have these rechecked for one year at her next annual physical exam. She is to call the office in the meantime if she has any acute issue, questions or concerns. - Comprehensive metabolic panel - Hemoglobin A1c - Lipid panel - TSH - CBC with Differential/Platelet  2. Encounter for breast cancer screening using non-mammogram modality Breast exam normal. Mammogram and [place] scheduled today.   3. Essential hypertension Stable. Continue Maxzide 37.5-25mg and amlodipine 77m. Will check labs as below and f/u pending results. - Comprehensive metabolic panel - Hemoglobin A1c - Lipid panel - TSH - CBC with Differential/Platelet  4. Hypercholesterolemia Diet controlled. Will check labs as below and f/u pending results. - Comprehensive metabolic panel - Hemoglobin A1c - Lipid panel - TSH - CBC with Differential/Platelet  5. Chronic fatigue Will check labs as below and f/u pending results. - Comprehensive metabolic panel - Hemoglobin A1c - Lipid panel - TSH - CBC with Differential/Platelet  6. Screening for HIV without presence of risk factors Will check labs as below and f/u pending results. - HIV Antibody (routine testing w rflx)  7. BMI 28.0-28.9,adult Doing well. Has lost 7 pounds in 3 weeks. Continue phentermine as below. Continue healthy lifestyle modifications.  - phentermine (ADIPEX-P) 37.5 MG tablet; Take 1 tablet (37.5 mg total) by mouth daily before breakfast.  Dispense: 30 tablet; Refill: 2  --------------------------------------------------------------------    JMar Daring  PA-C  [facility]

## 2019-05-19 ENCOUNTER — Ambulatory Visit
Admission: RE | Admit: 2019-05-19 | Discharge: 2019-05-19 | Disposition: A | Discharge status: Home / Self Care | Payer: No Typology Code available for payment source | Source: Ambulatory Visit | Attending: Physician Assistant | Admitting: Physician Assistant

## 2019-05-19 ENCOUNTER — Other Ambulatory Visit: Payer: Self-pay

## 2019-05-19 ENCOUNTER — Telehealth: Payer: Self-pay

## 2019-05-19 ENCOUNTER — Ambulatory Visit (INDEPENDENT_AMBULATORY_CARE_PROVIDER_SITE_OTHER): Payer: No Typology Code available for payment source | Admitting: Physician Assistant

## 2019-05-19 ENCOUNTER — Encounter: Payer: Self-pay | Admitting: Physician Assistant

## 2019-05-19 VITALS — BP 130/88 | HR 103 | Temp 97.3°F | Resp 16 | Ht 64.0 in | Wt 163.4 lb

## 2019-05-19 DIAGNOSIS — E7800 Pure hypercholesterolemia, unspecified: Secondary | ICD-10-CM

## 2019-05-19 DIAGNOSIS — Z6828 Body mass index (BMI) 28.0-28.9, adult: Secondary | ICD-10-CM | POA: Diagnosis not present

## 2019-05-19 DIAGNOSIS — Z0000 Encounter for general adult medical examination without abnormal findings: Secondary | ICD-10-CM

## 2019-05-19 DIAGNOSIS — Z114 Encounter for screening for human immunodeficiency virus [HIV]: Secondary | ICD-10-CM

## 2019-05-19 DIAGNOSIS — R921 Mammographic calcification found on diagnostic imaging of breast: Secondary | ICD-10-CM

## 2019-05-19 DIAGNOSIS — I10 Essential (primary) hypertension: Secondary | ICD-10-CM | POA: Diagnosis not present

## 2019-05-19 DIAGNOSIS — Z1239 Encounter for other screening for malignant neoplasm of breast: Secondary | ICD-10-CM | POA: Diagnosis not present

## 2019-05-19 DIAGNOSIS — R5382 Chronic fatigue, unspecified: Secondary | ICD-10-CM

## 2019-05-19 MED ORDER — PHENTERMINE HCL 37.5 MG PO TABS: 37.5000 mg | ORAL_TABLET | Freq: Every day | ORAL | 2 refills | Status: DC

## 2019-05-19 NOTE — Patient Instructions (Signed)
Health Maintenance, Female Adopting a healthy lifestyle and getting preventive care are important in promoting health and wellness. Ask your health care provider about:  The right schedule for you to have regular tests and exams.  Things you can do on your own to prevent diseases and keep yourself healthy. What should I know about diet, weight, and exercise? Eat a healthy diet   Eat a diet that includes plenty of vegetables, fruits, low-fat dairy products, and lean protein.  Do not eat a lot of foods that are high in solid fats, added sugars, or sodium. Maintain a healthy weight Body mass index (BMI) is used to identify weight problems. It estimates body fat based on height and weight. Your health care provider can help determine your BMI and help you achieve or maintain a healthy weight. Get regular exercise Get regular exercise. This is one of the most important things you can do for your health. Most adults should:  Exercise for at least 150 minutes each week. The exercise should increase your heart rate and make you sweat (moderate-intensity exercise).  Do strengthening exercises at least twice a week. This is in addition to the moderate-intensity exercise.  Spend less time sitting. Even light physical activity can be beneficial. Watch cholesterol and blood lipids Have your blood tested for lipids and cholesterol at 51 years of age, then have this test every 5 years. Have your cholesterol levels checked more often if:  Your lipid or cholesterol levels are high.  You are older than 51 years of age.  You are at high risk for heart disease. What should I know about cancer screening? Depending on your health history and family history, you may need to have cancer screening at various ages. This may include screening for:  Breast cancer.  Cervical cancer.  Colorectal cancer.  Skin cancer.  Lung cancer. What should I know about heart disease, diabetes, and high blood  pressure? Blood pressure and heart disease  High blood pressure causes heart disease and increases the risk of stroke. This is more likely to develop in people who have high blood pressure readings, are of African descent, or are overweight.  Have your blood pressure checked: ? Every 3-5 years if you are 18-39 years of age. ? Every year if you are 40 years old or older. Diabetes Have regular diabetes screenings. This checks your fasting blood sugar level. Have the screening done:  Once every three years after age 40 if you are at a normal weight and have a low risk for diabetes.  More often and at a younger age if you are overweight or have a high risk for diabetes. What should I know about preventing infection? Hepatitis B If you have a higher risk for hepatitis B, you should be screened for this virus. Talk with your health care provider to find out if you are at risk for hepatitis B infection. Hepatitis C Testing is recommended for:  Everyone born from 1945 through 1965.  Anyone with known risk factors for hepatitis C. Sexually transmitted infections (STIs)  Get screened for STIs, including gonorrhea and chlamydia, if: ? You are sexually active and are younger than 51 years of age. ? You are older than 51 years of age and your health care provider tells you that you are at risk for this type of infection. ? Your sexual activity has changed since you were last screened, and you are at increased risk for chlamydia or gonorrhea. Ask your health care provider if   you are at risk.  Ask your health care provider about whether you are at high risk for HIV. Your health care provider may recommend a prescription medicine to help prevent HIV infection. If you choose to take medicine to prevent HIV, you should first get tested for HIV. You should then be tested every 3 months for as long as you are taking the medicine. Pregnancy  If you are about to stop having your period (premenopausal) and  you may become pregnant, seek counseling before you get pregnant.  Take 400 to 800 micrograms (mcg) of folic acid every day if you become pregnant.  Ask for birth control (contraception) if you want to prevent pregnancy. Osteoporosis and menopause Osteoporosis is a disease in which the bones lose minerals and strength with aging. This can result in bone fractures. If you are 65 years old or older, or if you are at risk for osteoporosis and fractures, ask your health care provider if you should:  Be screened for bone loss.  Take a calcium or vitamin D supplement to lower your risk of fractures.  Be given hormone replacement therapy (HRT) to treat symptoms of menopause. Follow these instructions at home: Lifestyle  Do not use any products that contain nicotine or tobacco, such as cigarettes, e-cigarettes, and chewing tobacco. If you need help quitting, ask your health care provider.  Do not use street drugs.  Do not share needles.  Ask your health care provider for help if you need support or information about quitting drugs. Alcohol use  Do not drink alcohol if: ? Your health care provider tells you not to drink. ? You are pregnant, may be pregnant, or are planning to become pregnant.  If you drink alcohol: ? Limit how much you use to 0-1 drink a day. ? Limit intake if you are breastfeeding.  Be aware of how much alcohol is in your drink. In the [place] one drink equals one 12 oz bottle of beer (355 mL), one 5 oz glass of wine (148 mL), or one 1 oz glass of hard liquor (44 mL). General instructions  Schedule regular health, dental, and eye exams.  Stay current with your vaccines.  Tell your health care provider if: ? You often feel depressed. ? You have ever been abused or do not feel safe at home. Summary  Adopting a healthy lifestyle and getting preventive care are important in promoting health and wellness.  Follow your health care provider's instructions about healthy  diet, exercising, and getting tested or screened for diseases.  Follow your health care provider's instructions on monitoring your cholesterol and blood pressure. This information is not intended to replace advice given to you by your health care provider. Make sure you discuss any questions you have with your health care provider. Document Revised: 02/16/2018 Document Reviewed: 02/16/2018 Elsevier Patient Education  2020 [employer]  

## 2019-05-19 NOTE — Telephone Encounter (Signed)
-----   Message from Mar Daring, [place] sent at 05/19/2019 12:53 PM EST ----- Stable right breast calcifications. Recommend diagnostic bilateral mammogram in one year.

## 2019-05-19 NOTE — Telephone Encounter (Signed)
Patient advised as directed below.

## 2019-05-20 LAB — COMPREHENSIVE METABOLIC PANEL
ALT: 18 IU/L (ref 0–32)
AST: 14 IU/L (ref 0–40)
Albumin/Globulin Ratio: 1.3 (ref 1.2–2.2)
Albumin: 4.3 g/dL (ref 3.8–4.8)
Alkaline Phosphatase: 72 IU/L (ref 39–117)
BUN/Creatinine Ratio: 13 (ref 9–23)
BUN: 13 mg/dL (ref 6–24)
Bilirubin Total: 0.5 mg/dL (ref 0.0–1.2)
CO2: 22 mmol/L (ref 20–29)
Calcium: 9.5 mg/dL (ref 8.7–10.2)
Chloride: 100 mmol/L (ref 96–106)
Creatinine, Ser: 1.01 mg/dL — ABNORMAL HIGH (ref 0.57–1.00)
GFR calc Af Amer: 75 mL/min/{1.73_m2} (ref >59)
GFR calc non Af Amer: 65 mL/min/{1.73_m2} (ref >59)
Globulin, Total: 3.3 g/dL (ref 1.5–4.5)
Glucose: 86 mg/dL (ref 65–99)
Potassium: 4 mmol/L (ref 3.5–5.2)
Sodium: 139 mmol/L (ref 134–144)
Total Protein: 7.6 g/dL (ref 6.0–8.5)

## 2019-05-20 LAB — CBC WITH DIFFERENTIAL/PLATELET
Basophils Absolute: 0 10*3/uL (ref 0.0–0.2)
Basos: 0 %
EOS (ABSOLUTE): 0.1 10*3/uL (ref 0.0–0.4)
Eos: 1 %
Hematocrit: 43.9 % (ref 34.0–46.6)
Hemoglobin: 15.2 g/dL (ref 11.1–15.9)
Immature Grans (Abs): 0 10*3/uL (ref 0.0–0.1)
Immature Granulocytes: 0 %
Lymphocytes Absolute: 2.8 10*3/uL (ref 0.7–3.1)
Lymphs: 28 %
MCH: 30.3 pg (ref 26.6–33.0)
MCHC: 34.6 g/dL (ref 31.5–35.7)
MCV: 88 fL (ref 79–97)
Monocytes Absolute: 0.7 10*3/uL (ref 0.1–0.9)
Monocytes: 7 %
Neutrophils Absolute: 6.3 10*3/uL (ref 1.4–7.0)
Neutrophils: 64 %
Platelets: 353 10*3/uL (ref 150–450)
RBC: 5.01 x10E6/uL (ref 3.77–5.28)
RDW: 12 % (ref 11.7–15.4)
WBC: 9.9 10*3/uL (ref 3.4–10.8)

## 2019-05-20 LAB — HEMOGLOBIN A1C
Est. average glucose Bld gHb Est-mCnc: 111 mg/dL
Hgb A1c MFr Bld: 5.5 % (ref 4.8–5.6)

## 2019-05-20 LAB — LIPID PANEL
Chol/HDL Ratio: 4.1 ratio (ref 0.0–4.4)
Cholesterol, Total: 231 mg/dL — ABNORMAL HIGH (ref 100–199)
HDL: 56 mg/dL (ref >39)
LDL Chol Calc (NIH): 154 mg/dL — ABNORMAL HIGH (ref 0–99)
Triglycerides: 116 mg/dL (ref 0–149)
VLDL Cholesterol Cal: 21 mg/dL (ref 5–40)

## 2019-05-20 LAB — TSH: TSH: 0.96 u[IU]/mL (ref 0.450–4.500)

## 2019-05-20 LAB — HIV ANTIBODY (ROUTINE TESTING W REFLEX): HIV Screen 4th Generation wRfx: NONREACTIVE

## 2019-06-14 ENCOUNTER — Encounter: Payer: Self-pay | Admitting: Physician Assistant

## 2019-06-14 ENCOUNTER — Other Ambulatory Visit: Payer: Self-pay | Admitting: Physician Assistant

## 2019-06-14 DIAGNOSIS — J302 Other seasonal allergic rhinitis: Secondary | ICD-10-CM

## 2019-06-14 MED ORDER — AZELASTINE HCL 0.1 % NA SOLN: 1.0000 | Freq: Two times a day (BID) | NASAL | 12 refills | Status: DC

## 2019-06-16 ENCOUNTER — Encounter (INDEPENDENT_AMBULATORY_CARE_PROVIDER_SITE_OTHER): Payer: Self-pay | Admitting: Vascular Surgery

## 2019-06-16 ENCOUNTER — Other Ambulatory Visit: Payer: Self-pay

## 2019-06-16 ENCOUNTER — Ambulatory Visit (INDEPENDENT_AMBULATORY_CARE_PROVIDER_SITE_OTHER): Payer: No Typology Code available for payment source | Admitting: Vascular Surgery

## 2019-06-16 VITALS — BP 143/84 | HR 92 | Ht 65.0 in | Wt 165.0 lb

## 2019-06-16 DIAGNOSIS — R200 Anesthesia of skin: Secondary | ICD-10-CM | POA: Diagnosis not present

## 2019-06-16 DIAGNOSIS — I10 Essential (primary) hypertension: Secondary | ICD-10-CM

## 2019-06-16 DIAGNOSIS — L819 Disorder of pigmentation, unspecified: Secondary | ICD-10-CM | POA: Insufficient documentation

## 2019-06-16 DIAGNOSIS — E7800 Pure hypercholesterolemia, unspecified: Secondary | ICD-10-CM

## 2019-06-16 DIAGNOSIS — R202 Paresthesia of skin: Secondary | ICD-10-CM

## 2019-06-16 NOTE — Assessment & Plan Note (Signed)
blood pressure control important in reducing the progression of atherosclerotic disease. On appropriate oral medications.  

## 2019-06-16 NOTE — Progress Notes (Signed)
Patient ID: Ashley [name], female   DOB: 03-13-1970, 49 y.o.   MRN: [id number]  Chief Complaint  Patient presents with  . New Patient (Initial Visit)    [place]coloration     HPI Ashley [name] is a 49 y.o. female.  I am asked to see the patient by Ashley Catching, PA-C for evaluation of numbness and discoloration of her feet.  Patient has noted this for a year or 2 and has been steadily present.  There is no clear cause or inciting event that started the symptoms.  No history of DVT or superficial thrombophlebitis.  No ulceration or infection.  Both legs are affected but the right leg may be a little worse.  This has become more noticeable after she is taken a new job where she is sitting much of the day and not as active as she was.  She has intermittently had some issues with her back as well.  She complains of numbness and tingling in her feet and toes.  This is present fairly regularly and nothing really seems to make it better.  Nothing really seems to make it worse either.  She was prescribed compression stockings by her primary care physician and has been wearing those regularly at work without much improvement thus far.     No past medical history on file.  Past Surgical History:  Procedure Laterality Date  . BREAST BIOPSY Right 06/03/2018   X clip stereo bx CALCIUM OXALATE CRYSTALS WITHIN APOCRINE MICROCYSTS USUAL DUCTAL HYPERPLASIA, COLUMNAR CELL CHANGE, AND FOCAL PSEUDOANGIOMATOUS STROMAL HYPERPLASIA ([place]  . BREAST CYST ASPIRATION Right 10+ yrs ago     Family History  Problem Relation Age of Onset  . Breast cancer Neg Hx   No bleeding or clotting disorders.  No aneurysms.   Social History   Tobacco Use  . Smoking status: Never Smoker  . Smokeless tobacco: Never Used  Substance Use Topics  . Alcohol use: No    Alcohol/week: 0.0 standard drinks  . Drug use: Not on file     Allergies  Allergen Reactions  . Ciprofloxacin Hives  . Codeine Nausea And Vomiting  .  Gabapentin Nausea And Vomiting  . Hydrocodone-Acetaminophen Nausea And Vomiting  . Hydromorphone Hcl Nausea And Vomiting  . Oxycodone-Acetaminophen Nausea And Vomiting  . Promethazine Hcl Nausea And Vomiting  . Tramadol Nausea And Vomiting  . Sulfa Antibiotics Rash    hives    Current Outpatient Medications  Medication Sig Dispense Refill  . albuterol (VENTOLIN HFA) 108 (90 Base) MCG/ACT inhaler Use prior to exercise 18 g 0  . amLODipine (NORVASC) 5 MG tablet Take 1 tablet (5 mg total) by mouth daily. 90 tablet 3  . azelastine (ASTELIN) 0.1 % nasal spray Place 1 spray into both nostrils 2 (two) times daily. Use in each nostril as directed 30 mL 12  . norethindrone-ethinyl estradiol (LOESTRIN) 1-20 MG-MCG tablet Take 1 tablet by mouth daily. 84 tablet 4  . triamterene-hydrochlorothiazide (MAXZIDE-25) 37.5-25 MG tablet Take 1 tablet by mouth daily. 90 tablet 3  . phentermine (ADIPEX-P) 37.5 MG tablet Take 1 tablet (37.5 mg total) by mouth daily before breakfast. (Patient not taking: Reported on 06/16/2019) 30 tablet 2   No current facility-administered medications for this visit.      REVIEW OF SYSTEMS (Negative unless checked)  Constitutional: [] Weight loss  [] Fever  [] Chills Cardiac: [] Chest [name]   [] Chest pressure   [] Palpitations   [] Shortness of breath when laying flat   [] Shortness of  breath at rest   [] Shortness of breath with exertion. Vascular:  [] [name] in legs with walking   [] [name] in legs at rest   [] [name] in legs when laying flat   [] Claudication   [] [name] in feet when walking  [] [name] in feet at rest  [] [name] in feet when laying flat   [] History of DVT   [] Phlebitis   [] Swelling in legs   [] Varicose veins   [] Non-healing ulcers Pulmonary:   [] Uses home oxygen   [] Productive cough   [] Hemoptysis   [] Wheeze  [] COPD   [] Asthma Neurologic:  [] Dizziness  [] Blackouts   [] Seizures   [] History of stroke   [] History of TIA  [] Aphasia   [] Temporary blindness   [] Dysphagia   [] Weakness or  numbness in arms   [x] Weakness or numbness in legs Musculoskeletal:  [] Arthritis   [] Joint swelling   [] Joint [name]   [] Low back [name] Hematologic:  [] Easy bruising  [] Easy bleeding   [] Hypercoagulable state   [] Anemic  [] Hepatitis Gastrointestinal:  [] Blood in stool   [] Vomiting blood  [] Gastroesophageal reflux/heartburn   [] Abdominal [name] Genitourinary:  [] Chronic kidney disease   [] Difficult urination  [] Frequent urination  [] Burning with urination   [] Hematuria Skin:  [] Rashes   [] Ulcers   [] Wounds Psychological:  [] History of anxiety   []  History of major depression.    Physical Exam BP (!) 143/84   Pulse 92   Ht 5' 5"  (1.651 m)   Wt 165 lb (74.8 kg)   BMI 27.46 kg/m  Gen:  WD/WN, NAD Head: [place]/AT, No temporalis wasting.  Ear/Nose/Throat: Hearing grossly intact, nares w/o erythema or drainage, oropharynx w/o Erythema/Exudate Eyes: Conjunctiva clear, sclera non-icteric  Neck: trachea midline.  No JVD.  Pulmonary:  Good air movement, respirations not labored, no use of accessory muscles  Cardiac: RRR, no JVD Vascular:  Vessel Right Left  Radial Palpable Palpable                          DP  2+  2+  PT  2+  2+   Gastrointestinal:. No masses, surgical incisions, or scars. Musculoskeletal: M/S 5/5 throughout.  Extremities without ischemic changes.  No deformity or atrophy.  No edema. Neurologic: Sensation grossly intact in extremities.  Symmetrical.  Speech is fluent. Motor exam as listed above. Psychiatric: Judgment intact, Mood & affect appropriate for pt's clinical situation. Dermatologic: No rashes or ulcers noted.  No cellulitis or open wounds.    Radiology MM DIAG BREAST TOMO BILATERAL  Result Date: 05/19/2019 CLINICAL DATA:  One year follow-up for probably benign RIGHT breast calcifications. Patient stereotactic guided core biopsy of calcifications in UPPER OUTER QUADRANT of the RIGHT breast which demonstrated oxalate crystals with apocrine cyst, usual ductal  hyperplasia, columnar cell change, and focal pseudoangiomatous stromal hyperplasia.Close interval follow-up was recommended for additional calcifications in the RIGHT breast. EXAM: DIGITAL DIAGNOSTIC BILATERAL MAMMOGRAM WITH CAD AND TOMO COMPARISON:  Previous exam(s). ACR Breast Density Category c: The breast tissue is heterogeneously dense, which may obscure small masses. FINDINGS: Magnified views are performed of calcifications in the UPPER-OUTER QUADRANT of the RIGHT breast. Scattered, punctate calcifications are present and appear stable. No suspicious morphology or distribution of calcifications identified. An X shaped clip is identified in the [place] following previous benign biopsy. No new or suspicious findings in either breast. Mammographic images were processed with CAD. IMPRESSION: Stable, probably benign RIGHT breast calcifications. No mammographic evidence for malignancy. RECOMMENDATION: Bilateral diagnostic mammogram is recommended in  1 year. I have discussed the findings and recommendations with the patient. If applicable, a reminder letter will be sent to the patient regarding the next appointment. BI-RADS CATEGORY  3: Probably benign. Electronically Signed   By: Nolon Nations M.D.   On: 05/19/2019 10:28    Labs Recent Results (from the past 2160 hour(s))  Comprehensive metabolic panel     Status: Abnormal   Collection Time: 05/19/19  8:49 AM  Result Value Ref Range   Glucose 86 65 - 99 mg/dL   BUN 13 6 - 24 mg/dL   Creatinine, Ser 1.01 (H) 0.57 - 1.00 mg/dL   GFR calc non Af Amer 65 >59 mL/min/1.73   GFR calc Af Amer 75 >59 mL/min/1.73   BUN/Creatinine Ratio 13 9 - 23   Sodium 139 134 - 144 mmol/L   Potassium 4.0 3.5 - 5.2 mmol/L   Chloride 100 96 - 106 mmol/L   CO2 22 20 - 29 mmol/L   Calcium 9.5 8.7 - 10.2 mg/dL   Total Protein 7.6 6.0 - 8.5 g/dL   Albumin 4.3 3.8 - 4.8 g/dL   Globulin, Total 3.3 1.5 - 4.5 g/dL   Albumin/Globulin Ratio 1.3 1.2 - 2.2    Bilirubin Total 0.5 0.0 - 1.2 mg/dL   Alkaline Phosphatase 72 39 - 117 IU/L   AST 14 0 - 40 IU/L   ALT 18 0 - 32 IU/L  Hemoglobin A1c     Status: None   Collection Time: 05/19/19  8:49 AM  Result Value Ref Range   Hgb A1c MFr Bld 5.5 4.8 - 5.6 %    Comment:          Prediabetes: 5.7 - 6.4          Diabetes: >6.4          Glycemic control for adults with diabetes: <7.0    Est. average glucose Bld gHb Est-mCnc 111 mg/dL  HIV Antibody (routine testing w rflx)     Status: None   Collection Time: 05/19/19  8:49 AM  Result Value Ref Range   HIV Screen 4th Generation wRfx Non Reactive Non Reactive  Lipid panel     Status: Abnormal   Collection Time: 05/19/19  8:49 AM  Result Value Ref Range   Cholesterol, Total 231 (H) 100 - 199 mg/dL   Triglycerides 116 0 - 149 mg/dL   HDL 56 >39 mg/dL   VLDL Cholesterol Cal 21 5 - 40 mg/dL   LDL Chol Calc (NIH) 154 (H) 0 - 99 mg/dL   Chol/HDL Ratio 4.1 0.0 - 4.4 ratio    Comment:                                   T. Chol/HDL Ratio                                             Men  Women                               1/2 Avg.Risk  3.4    3.3  Avg.Risk  5.0    4.4                                2X Avg.Risk  9.6    7.1                                3X Avg.Risk 23.4   11.0   TSH     Status: None   Collection Time: 05/19/19  8:49 AM  Result Value Ref Range   TSH 0.960 0.450 - 4.500 uIU/mL  CBC with Differential/Platelet     Status: None   Collection Time: 05/19/19  8:49 AM  Result Value Ref Range   WBC 9.9 3.4 - 10.8 x10E3/uL   RBC 5.01 3.77 - 5.28 x10E6/uL   Hemoglobin 15.2 11.1 - 15.9 g/dL   Hematocrit 43.9 34.0 - 46.6 %   MCV 88 79 - 97 fL   MCH 30.3 26.6 - 33.0 pg   MCHC 34.6 31.5 - 35.7 g/dL   RDW 12.0 11.7 - 15.4 %   Platelets 353 150 - 450 x10E3/uL   Neutrophils 64 Not Estab. %   Lymphs 28 Not Estab. %   Monocytes 7 Not Estab. %   Eos 1 Not Estab. %   Basos 0 Not Estab. %   Neutrophils Absolute 6.3  1.4 - 7.0 x10E3/uL   Lymphocytes Absolute 2.8 0.7 - 3.1 x10E3/uL   Monocytes Absolute 0.7 0.1 - 0.9 x10E3/uL   EOS (ABSOLUTE) 0.1 0.0 - 0.4 x10E3/uL   Basophils Absolute 0.0 0.0 - 0.2 x10E3/uL   Immature Granulocytes 0 Not Estab. %   Immature Grans (Abs) 0.0 0.0 - 0.1 x10E3/uL    Assessment/Plan:  BP (high blood pressure) blood pressure control important in reducing the progression of atherosclerotic disease. On appropriate oral medications.   Hypercholesterolemia lipid control important in reducing the progression of atherosclerotic disease.    Numbness and tingling of both feet The patient describes what sounds like some neuropathy in both feet.  There could certainly be a component of venous insufficiency contributing to her lower extremity symptoms, and we will be performing a venous work-up as described below.  We discussed neuropathy that could be coming from some back issue she has had intermittently as well as idiopathic neuropathy.  She is not a diabetic.  We discussed medications for neuropathy but the side effects sometimes can be worse than the problems and at this time she does not really want to take any new medications which is reasonable.  We will see her back with her venous work-up.  Discoloration of skin of lower leg Patient has had some skin changes worrisome for stasis dermatitis of the lower extremities.  This has come and gone, but does seem to have correlated with her going to a job where she is sitting more.  I discussed the pathophysiology and natural history of venous disease.  Discussed neuropathy could also cause similar symptoms.  She has strong pulses and her symptoms do not sound arterial in nature.  I would recommend she continue wearing compression stockings daily.  I recommend increasing activity at work as much as possible and elevating her legs.  We will see her back with a venous reflux study in the near future at her convenience.      Ashley [name] 06/16/2019, 9:33 AM   This note was created with  Dragon medical transcription system.  Any errors from dictation are unintentional.

## 2019-06-16 NOTE — Patient Instructions (Signed)

## 2019-06-16 NOTE — Assessment & Plan Note (Signed)
Patient has had some skin changes worrisome for stasis dermatitis of the lower extremities.  This has come and gone, but does seem to have correlated with her going to a job where she is sitting more.  I discussed the pathophysiology and natural history of venous disease.  Discussed neuropathy could also cause similar symptoms.  She has strong pulses and her symptoms do not sound arterial in nature.  I would recommend she continue wearing compression stockings daily.  I recommend increasing activity at work as much as possible and elevating her legs.  We will see her back with a venous reflux study in the near future at her convenience.

## 2019-06-16 NOTE — Assessment & Plan Note (Signed)
lipid control important in reducing the progression of atherosclerotic disease.

## 2019-06-16 NOTE — Assessment & Plan Note (Signed)
The patient describes what sounds like some neuropathy in both feet.  There could certainly be a component of venous insufficiency contributing to her lower extremity symptoms, and we will be performing a venous work-up as described below.  We discussed neuropathy that could be coming from some back issue she has had intermittently as well as idiopathic neuropathy.  She is not a diabetic.  We discussed medications for neuropathy but the side effects sometimes can be worse than the problems and at this time she does not really want to take any new medications which is reasonable.  We will see her back with her venous work-up.

## 2019-06-19 ENCOUNTER — Ambulatory Visit (INDEPENDENT_AMBULATORY_CARE_PROVIDER_SITE_OTHER): Payer: No Typology Code available for payment source | Admitting: Physician Assistant

## 2019-06-19 ENCOUNTER — Other Ambulatory Visit: Payer: Self-pay

## 2019-06-19 DIAGNOSIS — Z23 Encounter for immunization: Secondary | ICD-10-CM | POA: Diagnosis not present

## 2019-06-20 ENCOUNTER — Ambulatory Visit (INDEPENDENT_AMBULATORY_CARE_PROVIDER_SITE_OTHER): Payer: No Typology Code available for payment source

## 2019-06-20 ENCOUNTER — Other Ambulatory Visit: Payer: Self-pay

## 2019-06-20 ENCOUNTER — Encounter (INDEPENDENT_AMBULATORY_CARE_PROVIDER_SITE_OTHER): Payer: Self-pay | Admitting: Vascular Surgery

## 2019-06-20 ENCOUNTER — Ambulatory Visit (INDEPENDENT_AMBULATORY_CARE_PROVIDER_SITE_OTHER): Payer: No Typology Code available for payment source | Admitting: Vascular Surgery

## 2019-06-20 VITALS — BP 142/86 | HR 92 | Ht 65.0 in | Wt 169.0 lb

## 2019-06-20 DIAGNOSIS — E7800 Pure hypercholesterolemia, unspecified: Secondary | ICD-10-CM

## 2019-06-20 DIAGNOSIS — R202 Paresthesia of skin: Secondary | ICD-10-CM | POA: Diagnosis not present

## 2019-06-20 DIAGNOSIS — I10 Essential (primary) hypertension: Secondary | ICD-10-CM

## 2019-06-20 DIAGNOSIS — L819 Disorder of pigmentation, unspecified: Secondary | ICD-10-CM

## 2019-06-20 DIAGNOSIS — R200 Anesthesia of skin: Secondary | ICD-10-CM | POA: Diagnosis not present

## 2019-06-20 DIAGNOSIS — I8310 Varicose veins of unspecified lower extremity with inflammation: Secondary | ICD-10-CM | POA: Diagnosis not present

## 2019-06-20 NOTE — Assessment & Plan Note (Signed)
A venous reflux study was done today which demonstrated significant venous reflux in both great saphenous veins with no evidence of DVT or superficial thrombophlebitis. This is likely a major contributing factor to her lower extremity discoloration and numbness.  There may be a neuropathic component from back issues as well, but I think appropriate treatment for venous disease is appropriate at this time.  She will begin wearing medical grade 20 to 30 mmHg compression stockings on a daily basis and elevating her legs.  She can take anti-inflammatories as needed for discomfort.  We will reassess her in 3 months after this trial of conservative therapy to discuss the results and determine further treatment options.

## 2019-06-20 NOTE — Progress Notes (Signed)
MRN : [id number]  Ashley [name] is a 49 y.o. (06-27-1970) female who presents with chief complaint of  Chief Complaint  Patient presents with  . Follow-up    U/S Follow up  .  History of Present Illness: Patient returns today in follow up of her numbness and discoloration of the lower extremities.  No change in her symptoms from her initial visit but that was only a week ago.  A venous reflux study was done today which demonstrated significant venous reflux in both great saphenous veins with no evidence of DVT or superficial thrombophlebitis.  Current Outpatient Medications  Medication Sig Dispense Refill  . albuterol (VENTOLIN HFA) 108 (90 Base) MCG/ACT inhaler Use prior to exercise 18 g 0  . amLODipine (NORVASC) 5 MG tablet Take 1 tablet (5 mg total) by mouth daily. 90 tablet 3  . azelastine (ASTELIN) 0.1 % nasal spray Place 1 spray into both nostrils 2 (two) times daily. Use in each nostril as directed 30 mL 12  . norethindrone-ethinyl estradiol (LOESTRIN) 1-20 MG-MCG tablet Take 1 tablet by mouth daily. 84 tablet 4  . phentermine (ADIPEX-P) 37.5 MG tablet Take 1 tablet (37.5 mg total) by mouth daily before breakfast. 30 tablet 2  . triamterene-hydrochlorothiazide (MAXZIDE-25) 37.5-25 MG tablet Take 1 tablet by mouth daily. 90 tablet 3   No current facility-administered medications for this visit.    No past medical history on file.  Past Surgical History:  Procedure Laterality Date  . BREAST BIOPSY Right 06/03/2018   X clip stereo bx CALCIUM OXALATE CRYSTALS WITHIN APOCRINE MICROCYSTS USUAL DUCTAL HYPERPLASIA, COLUMNAR CELL CHANGE, AND FOCAL PSEUDOANGIOMATOUS STROMAL HYPERPLASIA ([place]  . BREAST CYST ASPIRATION Right 10+ yrs ago     Social History   Tobacco Use  . Smoking status: Never Smoker  . Smokeless tobacco: Never Used  Substance Use Topics  . Alcohol use: No    Alcohol/week: 0.0 standard drinks  . Drug use: Not on file    Family History  Problem Relation  Age of Onset  . Breast cancer Neg Hx      Allergies  Allergen Reactions  . Ciprofloxacin Hives  . Codeine Nausea And Vomiting  . Gabapentin Nausea And Vomiting  . Hydrocodone-Acetaminophen Nausea And Vomiting  . Hydromorphone Hcl Nausea And Vomiting  . Oxycodone-Acetaminophen Nausea And Vomiting  . Promethazine Hcl Nausea And Vomiting  . Tramadol Nausea And Vomiting  . Sulfa Antibiotics Rash    hives     REVIEW OF SYSTEMS (Negative unless checked)  Constitutional: [] Weight loss  [] Fever  [] Chills Cardiac: [] Chest pain   [] Chest pressure   [] Palpitations   [] Shortness of breath when laying flat   [] Shortness of breath at rest   [] Shortness of breath with exertion. Vascular:  [] Pain in legs with walking   [] Pain in legs at rest   [] Pain in legs when laying flat   [] Claudication   [] Pain in feet when walking  [] Pain in feet at rest  [] Pain in feet when laying flat   [] History of DVT   [] Phlebitis   [] Swelling in legs   [] Varicose veins   [] Non-healing ulcers Pulmonary:   [] Uses home oxygen   [] Productive cough   [] Hemoptysis   [] Wheeze  [] COPD   [] Asthma Neurologic:  [] Dizziness  [] Blackouts   [] Seizures   [] History of stroke   [] History of TIA  [] Aphasia   [] Temporary blindness   [] Dysphagia   [] Weakness or numbness in arms   [x] Weakness or numbness in legs Musculoskeletal:  []   Arthritis   [] Joint swelling   [] Joint pain   [] Low back pain Hematologic:  [] Easy bruising  [] Easy bleeding   [] Hypercoagulable state   [] Anemic   Gastrointestinal:  [] Blood in stool   [] Vomiting blood  [] Gastroesophageal reflux/heartburn   [] Abdominal pain Genitourinary:  [] Chronic kidney disease   [] Difficult urination  [] Frequent urination  [] Burning with urination   [] Hematuria Skin:  [] Rashes   [] Ulcers   [] Wounds Psychological:  [] History of anxiety   []  History of major depression.  Physical Examination  BP (!) 142/86   Pulse 92   Ht 5' 5"  (1.651 m)   Wt 169 lb (76.7 kg)   BMI 28.12 kg/m  Gen:   WD/WN, NAD Head: [place]/AT, No temporalis wasting. Ear/Nose/Throat: Hearing grossly intact, nares w/o erythema or drainage Eyes: Conjunctiva clear. Sclera non-icteric Neck: Supple.  Trachea midline Pulmonary:  Good air movement, no use of accessory muscles.  Cardiac: RRR, no JVD Vascular:  Vessel Right Left  Radial Palpable Palpable                          PT Palpable Palpable  DP Palpable Palpable     Musculoskeletal: M/S 5/5 throughout.  No deformity or atrophy. No edema. Neurologic: Sensation grossly intact in extremities.  Symmetrical.  Speech is fluent.  Psychiatric: Judgment intact, Mood & affect appropriate for pt's clinical situation. Dermatologic: No rashes or ulcers noted.  No cellulitis or open wounds.       Labs Recent Results (from the past 2160 hour(s))  Comprehensive metabolic panel     Status: Abnormal   Collection Time: 05/19/19  8:49 AM  Result Value Ref Range   Glucose 86 65 - 99 mg/dL   BUN 13 6 - 24 mg/dL   Creatinine, Ser 1.01 (H) 0.57 - 1.00 mg/dL   GFR calc non Af Amer 65 >59 mL/min/1.73   GFR calc Af Amer 75 >59 mL/min/1.73   BUN/Creatinine Ratio 13 9 - 23   Sodium 139 134 - 144 mmol/L   Potassium 4.0 3.5 - 5.2 mmol/L   Chloride 100 96 - 106 mmol/L   CO2 22 20 - 29 mmol/L   Calcium 9.5 8.7 - 10.2 mg/dL   Total Protein 7.6 6.0 - 8.5 g/dL   Albumin 4.3 3.8 - 4.8 g/dL   Globulin, Total 3.3 1.5 - 4.5 g/dL   Albumin/Globulin Ratio 1.3 1.2 - 2.2   Bilirubin Total 0.5 0.0 - 1.2 mg/dL   Alkaline Phosphatase 72 39 - 117 IU/L   AST 14 0 - 40 IU/L   ALT 18 0 - 32 IU/L  Hemoglobin A1c     Status: None   Collection Time: 05/19/19  8:49 AM  Result Value Ref Range   Hgb A1c MFr Bld 5.5 4.8 - 5.6 %    Comment:          Prediabetes: 5.7 - 6.4          Diabetes: >6.4          Glycemic control for adults with diabetes: <7.0    Est. average glucose Bld gHb Est-mCnc 111 mg/dL  HIV Antibody (routine testing w rflx)     Status: None   Collection Time:  05/19/19  8:49 AM  Result Value Ref Range   HIV Screen 4th Generation wRfx Non Reactive Non Reactive  Lipid panel     Status: Abnormal   Collection Time: 05/19/19  8:49 AM  Result Value Ref Range  Cholesterol, Total 231 (H) 100 - 199 mg/dL   Triglycerides 116 0 - 149 mg/dL   HDL 56 >39 mg/dL   VLDL Cholesterol Cal 21 5 - 40 mg/dL   LDL Chol Calc (NIH) 154 (H) 0 - 99 mg/dL   Chol/HDL Ratio 4.1 0.0 - 4.4 ratio    Comment:                                   T. Chol/HDL Ratio                                             Men  Women                               1/2 Avg.Risk  3.4    3.3                                   Avg.Risk  5.0    4.4                                2X Avg.Risk  9.6    7.1                                3X Avg.Risk 23.4   11.0   TSH     Status: None   Collection Time: 05/19/19  8:49 AM  Result Value Ref Range   TSH 0.960 0.450 - 4.500 uIU/mL  CBC with Differential/Platelet     Status: None   Collection Time: 05/19/19  8:49 AM  Result Value Ref Range   WBC 9.9 3.4 - 10.8 x10E3/uL   RBC 5.01 3.77 - 5.28 x10E6/uL   Hemoglobin 15.2 11.1 - 15.9 g/dL   Hematocrit 43.9 34.0 - 46.6 %   MCV 88 79 - 97 fL   MCH 30.3 26.6 - 33.0 pg   MCHC 34.6 31.5 - 35.7 g/dL   RDW 12.0 11.7 - 15.4 %   Platelets 353 150 - 450 x10E3/uL   Neutrophils 64 Not Estab. %   Lymphs 28 Not Estab. %   Monocytes 7 Not Estab. %   Eos 1 Not Estab. %   Basos 0 Not Estab. %   Neutrophils Absolute 6.3 1.4 - 7.0 x10E3/uL   Lymphocytes Absolute 2.8 0.7 - 3.1 x10E3/uL   Monocytes Absolute 0.7 0.1 - 0.9 x10E3/uL   EOS (ABSOLUTE) 0.1 0.0 - 0.4 x10E3/uL   Basophils Absolute 0.0 0.0 - 0.2 x10E3/uL   Immature Granulocytes 0 Not Estab. %   Immature Grans (Abs) 0.0 0.0 - 0.1 x10E3/uL    Radiology VAS [place] LOWER EXTREMITY VENOUS REFLUX  Result Date: 06/20/2019  Lower Venous Reflux Study Other Indications: Ankle foot pain. Performing Technologist: Concha [place] RVT  Examination Guidelines: A complete  evaluation includes B-mode imaging, spectral Doppler, color Doppler, and power Doppler as needed of all accessible portions of each vessel. Bilateral testing is considered an integral part of a complete examination. Limited examinations for reoccurring indications may be performed as noted. The reflux portion of the  exam is performed with the patient in reverse Trendelenburg. Significant venous reflux is defined as >500 ms in the superficial venous system, and >1 second in the deep venous system.  Venous Reflux Times +--------------+---------+------+-----------+------------+--------+ RIGHT         Reflux NoRefluxReflux TimeDiameter cmsComments                         Yes                                  +--------------+---------+------+-----------+------------+--------+ CFV                             .858                         +--------------+---------+------+-----------+------------+--------+ GSV at SFJ              yes      653        .64              +--------------+---------+------+-----------+------------+--------+ GSV mid thigh           yes      799        .53              +--------------+---------+------+-----------+------------+--------+ GSV dist thigh          yes      631        .35              +--------------+---------+------+-----------+------------+--------+  +--------------+---------+------+-----------+------------+--------+ LEFT          Reflux NoRefluxReflux TimeDiameter cmsComments                         Yes                                  +--------------+---------+------+-----------+------------+--------+ CFV                              631                         +--------------+---------+------+-----------+------------+--------+ GSV dist thigh          yes     3447        .35              +--------------+---------+------+-----------+------------+--------+   Summary: Right: - No evidence of deep vein thrombosis seen in  the right lower extremity, from the common femoral through the popliteal veins. - There is no evidence of venous reflux seen in the right lower extremity. - No evidence of superficial venous reflux seen in the right short saphenous vein. - Venous reflux is noted in the right sapheno-femoral junction. - Venous reflux is noted in the right greater saphenous vein in the thigh.  Left: - No evidence of deep vein thrombosis seen in the left lower extremity, from the common femoral through the popliteal veins. - There is no evidence of venous reflux seen in the left lower extremity. - No evidence of superficial venous reflux seen in the left short saphenous vein. - Venous reflux is noted in the left greater saphenous  vein in the thigh.  *See table(s) above for measurements and observations. Electronically signed by Leotis Pain MD on 06/20/2019 at 8:13:25 AM.    Final     Assessment/Plan BP (high blood pressure) blood pressure control important in reducing the progression of atherosclerotic disease. On appropriate oral medications.   Hypercholesterolemia lipid control important in reducing the progression of atherosclerotic disease.   Numbness and tingling of both feet A venous reflux study was done today which demonstrated significant venous reflux in both great saphenous veins with no evidence of DVT or superficial thrombophlebitis. This is likely a major contributing factor to her lower extremity discoloration and numbness.  There may be a neuropathic component from back issues as well, but I think appropriate treatment for venous disease is appropriate at this time.  She will begin wearing medical grade 20 to 30 mmHg compression stockings on a daily basis and elevating her legs.  She can take anti-inflammatories as needed for discomfort.  We will reassess her in 3 months after this trial of conservative therapy to discuss the results and determine further treatment options.  Varicose veins with  inflammation A venous reflux study was done today which demonstrated significant venous reflux in both great saphenous veins with no evidence of DVT or superficial thrombophlebitis. This is likely a major contributing factor to her lower extremity discoloration and numbness.  There may be a neuropathic component from back issues as well, but I think appropriate treatment for venous disease is appropriate at this time.  She will begin wearing medical grade 20 to 30 mmHg compression stockings on a daily basis and elevating her legs.  She can take anti-inflammatories as needed for discomfort.  We will reassess her in 3 months after this trial of conservative therapy to discuss the results and determine further treatment options.    Leotis Pain, MD  06/20/2019 9:07 AM    This note was created with Dragon medical transcription system.  Any errors from dictation are purely unintentional

## 2019-09-15 ENCOUNTER — Ambulatory Visit (INDEPENDENT_AMBULATORY_CARE_PROVIDER_SITE_OTHER): Payer: No Typology Code available for payment source | Admitting: Vascular Surgery

## 2019-09-15 ENCOUNTER — Other Ambulatory Visit: Payer: Self-pay

## 2019-09-15 ENCOUNTER — Encounter (INDEPENDENT_AMBULATORY_CARE_PROVIDER_SITE_OTHER): Payer: Self-pay | Admitting: Vascular Surgery

## 2019-09-15 VITALS — BP 139/90 | HR 92 | Ht 64.0 in | Wt 164.0 lb

## 2019-09-15 DIAGNOSIS — I10 Essential (primary) hypertension: Secondary | ICD-10-CM

## 2019-09-15 DIAGNOSIS — E7800 Pure hypercholesterolemia, unspecified: Secondary | ICD-10-CM | POA: Diagnosis not present

## 2019-09-15 DIAGNOSIS — I8310 Varicose veins of unspecified lower extremity with inflammation: Secondary | ICD-10-CM

## 2019-09-15 NOTE — Progress Notes (Signed)
Patient ID: Ashley [name], female   DOB: 06/23/1970, 49 y.o.   MRN: [id number]  Chief Complaint  Patient presents with  . Follow-up    3 mo no studies    HPI Ashley [name] is a 49 y.o. female.  Patient returns in follow up of their venous disease.  They have done their best to comply with the prescribed conservative therapies of compression stockings, leg elevation, exercise, and still requires anti-inflammatories for discomfort and has symptoms that are persistent and bothersome on a daily basis, affecting their activities of daily living and normal activities.  Her biggest complaint is of a numbness and tingling kind of like a pins-and-needles sensation in both feet and lower legs.  The venous reflux study demonstrates bilateral long segment great saphenous vein reflux.    No past medical history on file.  Past Surgical History:  Procedure Laterality Date  . BREAST BIOPSY Right 06/03/2018   X clip stereo bx CALCIUM OXALATE CRYSTALS WITHIN APOCRINE MICROCYSTS USUAL DUCTAL HYPERPLASIA, COLUMNAR CELL CHANGE, AND FOCAL PSEUDOANGIOMATOUS STROMAL HYPERPLASIA ([place]  . BREAST CYST ASPIRATION Right 10+ yrs ago    Family History  Problem Relation Age of Onset  . Breast cancer Neg Hx      Social History   Tobacco Use  . Smoking status: Never Smoker  . Smokeless tobacco: Never Used  Substance Use Topics  . Alcohol use: No    Alcohol/week: 0.0 standard drinks  . Drug use: Not on file     Allergies  Allergen Reactions  . Ciprofloxacin Hives  . Codeine Nausea And Vomiting  . Gabapentin Nausea And Vomiting  . Hydrocodone-Acetaminophen Nausea And Vomiting  . Hydromorphone Hcl Nausea And Vomiting  . Oxycodone-Acetaminophen Nausea And Vomiting  . Promethazine Hcl Nausea And Vomiting  . Tramadol Nausea And Vomiting  . Sulfa Antibiotics Rash    hives    Current Outpatient Medications  Medication Sig Dispense Refill  . albuterol (VENTOLIN HFA) 108 (90 Base) MCG/ACT inhaler  Use prior to exercise 18 g 0  . amLODipine (NORVASC) 5 MG tablet Take 1 tablet (5 mg total) by mouth daily. 90 tablet 3  . azelastine (ASTELIN) 0.1 % nasal spray Place 1 spray into both nostrils 2 (two) times daily. Use in each nostril as directed 30 mL 12  . norethindrone-ethinyl estradiol (LOESTRIN) 1-20 MG-MCG tablet Take 1 tablet by mouth daily. 84 tablet 4  . triamterene-hydrochlorothiazide (MAXZIDE-25) 37.5-25 MG tablet Take 1 tablet by mouth daily. 90 tablet 3  . phentermine (ADIPEX-P) 37.5 MG tablet Take 1 tablet (37.5 mg total) by mouth daily before breakfast. (Patient not taking: Reported on 09/15/2019) 30 tablet 2   No current facility-administered medications for this visit.      REVIEW OF SYSTEMS (Negative unless checked)  Constitutional: [] Weight loss  [] Fever  [] Chills Cardiac: [] Chest pain   [] Chest pressure   [] Palpitations   [] Shortness of breath when laying flat   [] Shortness of breath at rest   [] Shortness of breath with exertion. Vascular:  [] Pain in legs with walking   [] Pain in legs at rest   [] Pain in legs when laying flat   [] Claudication   [] Pain in feet when walking  [] Pain in feet at rest  [] Pain in feet when laying flat   [] History of DVT   [] Phlebitis   [] Swelling in legs   [x] Varicose veins   [] Non-healing ulcers Pulmonary:   [] Uses home oxygen   [] Productive cough   [] Hemoptysis   [] Wheeze  [] COPD   []   Asthma Neurologic:  [] Dizziness  [] Blackouts   [] Seizures   [] History of stroke   [] History of TIA  [] Aphasia   [] Temporary blindness   [] Dysphagia   [] Weakness or numbness in arms   [x] Weakness or numbness in legs Musculoskeletal:  [] Arthritis   [] Joint swelling   [] Joint pain   [] Low back pain Hematologic:  [] Easy bruising  [] Easy bleeding   [] Hypercoagulable state   [] Anemic  [] Hepatitis Gastrointestinal:  [] Blood in stool   [] Vomiting blood  [] Gastroesophageal reflux/heartburn   [] Abdominal pain Genitourinary:  [] Chronic kidney disease   [] Difficult urination   [] Frequent urination  [] Burning with urination   [] Hematuria Skin:  [] Rashes   [] Ulcers   [] Wounds Psychological:  [] History of anxiety   []  History of major depression.    Physical Exam BP 139/90   Pulse 92   Ht 5' 4"  (1.626 m)   Wt 164 lb (74.4 kg)   BMI 28.15 kg/m  Gen:  WD/WN, NAD Head: [place]/AT, No temporalis wasting.  Ear/Nose/Throat: Hearing grossly intact, dentition good Eyes: Sclera non-icteric. Conjunctiva clear Neck: Supple. Trachea midline Pulmonary:  Good air movement, no use of accessory muscles, respirations not labored.  Cardiac: RRR, No JVD Vascular: Scattered varicosities bilaterally Vessel Right Left  Radial Palpable Palpable                          PT Palpable Palpable  DP Palpable Palpable    Musculoskeletal: M/S 5/5 throughout. Trace LE edema. Neurologic: Sensation grossly intact in extremities.  Symmetrical.  Speech is fluent.  Psychiatric: Judgment intact, Mood & affect appropriate for pt's clinical situation. Dermatologic: No rashes or ulcers noted.  No cellulitis or open wounds.  Radiology No results found.  Labs No results found for this or any previous visit (from the past 2160 hour(s)).  Assessment/Plan: BP (high blood pressure) blood pressure control important in reducing the progression of atherosclerotic disease. On appropriate oral medications.   Hypercholesterolemia lipid control important in reducing the progression of atherosclerotic disease.  Varicose veins with inflammation   The patient has done their best to comply with conservative therapy of 20-30 mm Hg compression stockings, leg elevation, exercise, and anti-inflammatories as needed for discomfort.  Despite this, they continue to have daily and persistent symptoms from their venous disease.  A venous reflux study demonstrates bilateral long segment great saphenous vein reflux.  As such, the patient is likely to benefit from endovenous laser ablation of the great  saphenous vein on both sides in a staged fashion.  Risks and benefits of the procedure including bleeding, infection, recanalization, DVT, and need for further therapy for residual varicosities were discussed.  The patient voices their understanding and is agreeable to proceed with staged bilateral great saphenous vein laser ablation.     Leotis Pain 09/15/2019, 10:55 AM

## 2019-09-15 NOTE — Patient Instructions (Signed)
Nonsurgical Procedures for Varicose Veins Various nonsurgical procedures can be used to treat varicose veins. Varicose veins are swollen, twisted veins that are visible under the skin. They occur most often in the legs. These veins may appear blue and bulging. Varicose veins are caused by damage to the valves in veins. All veins have a valve that makes blood flow in only one direction. If a valve gets weak or damaged, blood can pool and cause varicose veins. You may need a procedure to treat your varicose veins if they are causing symptoms or complications, or if lifestyle changes have not helped. These procedures can reduce pain, aching, and the risk of bleeding and blood clots. They can also improve the way the affected area looks (cosmetic appearance). The three common nonsurgical procedures are:  Sclerotherapy. A chemical is injected to close off a vein.  Laser treatment. Light energy is applied to close off the vein.  Radiofrequency vein ablation. Electrical energy is used to produce heat that closes off the vein. Your health care provider will discuss the method that is best for you based on your condition. Tell a health care provider about:  Any allergies you have.  All medicines you are taking, including vitamins, herbs, eye drops, creams, and over-the-counter medicines.  Any problems you or family members have had with anesthetic medicines.  Any blood disorders you have.  Any surgeries you have had.  Any medical conditions you have.  Whether you are pregnant or may be pregnant. What are the risks? Generally, this is a safe procedure. However, problems may occur, including:  Damage to nearby nerves, tissues, or veins.  Skin irritation, sores, or dark spots.  Numbness.  Clotting.  Infection.  Allergic reactions to medicines.  Scarring.  Leg swelling.  Need for additional treatments.  Bruising. What happens before the procedure?  Ask your health care provider  about: ? Changing or stopping your regular medicines. This is especially important if you are taking diabetes medicines or blood thinners. ? Taking over-the-counter medicines, vitamins, herbs, and supplements. ? Taking medicines such as aspirin and ibuprofen. These medicines can thin your blood. Do not take these medicines unless your health care provider tells you to take them.  You may have an exam or testing. This can include a tests to: ? Check for clots and check blood flow using sound waves (Doppler ultrasound). ? Observe how blood flows through your veins by injecting a dye that outlines your veins on X-rays (angiogram). This test is used in rare cases. What happens during the procedure? One of the following procedures will be performed: Sclerotherapy This procedure is often used for small to medium veins.  A chemical (sclerosant) that irritates the lining of the vein will be injected into the vein. This will cause the varicose vein to be closed off. Sclerosants in different amounts and strengths can be used, depending on the size and location of the vein.  All of the varicose vein sites will be injected. You may need more than one treatment because new varicose veins may develop, or more than one injection may be needed for each varicose vein.  Laser treatment There are two ways that lasers are used to treat varicose veins:  Light energy from a laser may be directed onto the vein through the skin.  A needle may be used to pass a thin laser catheter into the vein to cause it to close. You may need more than one treatment if the vein re-opens. In some cases,  laser treatment may be combined with sclerotherapy. Radiofrequency vein ablation   You will be given a medicine that numbs the area (local anesthetic).  A small incision will be made near the varicose vein.  A thin tube (catheter) will be threaded into your vein.  The tip of the catheter will deploy electrodes.  The  electrodes will deliver electrical energy to produce heat that closes off the vein. What happens after the procedure?  A bandage (dressing) may be used to cover the injection site or incisions.  You may have to wear compression stockings. These stockings help to prevent blood clots and reduce swelling in your legs.  Return to your normal activities as told by your health care provider. Summary  Varicose veins are swollen, twisted veins that are visible under the skin. They occur most often in the legs.  Various procedures can be used to treat varicose veins. You may need a procedure to treat your varicose veins if they are causing symptoms or complications, or if lifestyle changes have not helped.  Your health care provider will discuss the method that is best for you based on your condition. This information is not intended to replace advice given to you by your health care provider. Make sure you discuss any questions you have with your health care provider. Document Revised: 06/17/2018 Document Reviewed: 06/05/2016 Elsevier Patient Education  [place].

## 2019-09-25 ENCOUNTER — Ambulatory Visit
Admission: EM | Admit: 2019-09-25 | Discharge: 2019-09-25 | Disposition: A | Discharge status: Home / Self Care | Payer: No Typology Code available for payment source | Attending: Internal Medicine | Admitting: Internal Medicine

## 2019-09-25 ENCOUNTER — Ambulatory Visit: Payer: Self-pay | Admitting: *Deleted

## 2019-09-25 ENCOUNTER — Encounter: Payer: Self-pay | Admitting: Physician Assistant

## 2019-09-25 ENCOUNTER — Ambulatory Visit (INDEPENDENT_AMBULATORY_CARE_PROVIDER_SITE_OTHER): Payer: No Typology Code available for payment source

## 2019-09-25 ENCOUNTER — Encounter: Payer: Self-pay | Admitting: Emergency Medicine

## 2019-09-25 ENCOUNTER — Other Ambulatory Visit: Payer: Self-pay

## 2019-09-25 DIAGNOSIS — S161XXA Strain of muscle, fascia and tendon at neck level, initial encounter: Secondary | ICD-10-CM

## 2019-09-25 DIAGNOSIS — R55 Syncope and collapse: Secondary | ICD-10-CM

## 2019-09-25 DIAGNOSIS — S0003XA Contusion of scalp, initial encounter: Secondary | ICD-10-CM | POA: Diagnosis not present

## 2019-09-25 DIAGNOSIS — S39012A Strain of muscle, fascia and tendon of lower back, initial encounter: Secondary | ICD-10-CM | POA: Diagnosis not present

## 2019-09-25 HISTORY — DX: Essential (primary) hypertension: I10

## 2019-09-25 HISTORY — DX: Migraine, unspecified, not intractable, without status migrainosus: G43.909

## 2019-09-25 HISTORY — DX: Hemangioma of intracranial structures: D18.02

## 2019-09-25 MED ORDER — DICLOFENAC SODIUM 50 MG PO TBEC: 50.0000 mg | DELAYED_RELEASE_TABLET | Freq: Two times a day (BID) | ORAL | 0 refills | Status: DC

## 2019-09-25 MED ORDER — CYCLOBENZAPRINE HCL 10 MG PO TABS: ORAL_TABLET | ORAL | 0 refills | Status: DC

## 2019-09-25 NOTE — ED Triage Notes (Signed)
Patient in today c/o syncopal episode in the early hours of Sunday (09/24/19) morning. Patient had an injury to her ankle on Saturday  (09/23/19) and she took Flexeril Saturday night ~8-8:30pm. Patient states she woke up Sunday morning (~12:30-1am) to go to the bathroom. She states she was nauseous and she passed out. Patient's boyfriend told her she had a BM while she was passed out. Patient does not remember any of this. Patient in today c/o headache and neck pain.

## 2019-09-25 NOTE — Telephone Encounter (Signed)
Pt called in.   While out of town over the weekend I was rock climbing and fell.  I hurt my right ankle.  I took a Flexeril before going to bed to see if it would help.   During the night when I got up to go to the bathroom I became real hot, sweaty and nauseas next thing I know I was passed out on the floor in the bathroom.  My husband was there but he was in the kitchen doing something when I fell so he didn't know I had fallen.  When I came to I called out for him and he helped me get up but I was in and out of it during that time.   I don't know how long I was passed out.  She is c/o a headache all over her head, pressure in her forehead.   She sustained 2 1 inch cuts on her forehead at the hairline in the middle of her forehead above her nose.  Her husband put Steri-strips on them.   She put ice on the bump yesterday that helped the swelling but I still have a bump there.  She has a bruise on her right shoulder, pain in her neck especially on the right side, pain in her right lower back too.  Her right ankle is painful and her right knee.  She is having a hard time walking.  She drove home this morning 2 hours from out of town in the pouring rain.  "I know I should not have done that but I just wanted to get home".  She denies being dizzy presently or her vision being impaired presently.  She has a dental appt tomorrow because she broke a tooth during the fall in the bathroom too.  I instructed her to go to the ED.   She is refusing to call 911.  She does not have anyone that can take her to the ED.  "I can drive myself".   "It's only 20 minutes to the [facility]".   "They have a CT scanner so a scan can be done of my head".    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     Since she is refusing to go by ambulance I instructed her to be  very, very careful.  She is going to go on now to the urgent care.  She also sent a MyChart message this  morning.  I forwarded my notes to Fenton Malling, PA-C at [facility].   Reason for Disposition  Knocked out (unconscious) > 1 minute  Answer Assessment - Initial Assessment Questions 1. MECHANISM: "How did the injury happen?" For falls, ask: "What height did you fall from?" and "What surface did you fall against?"      I left a message on MyChart.    Saturday I fell and hurt my ankle.   I took a Flexeril.   I got up to go to the bathroom during the night.   I got hot, sweaty and nauseas.   Next thing I know I was passed out on the floor. I have a cut on my forehead.   I put ice on it yesterday.   I put ice on my head.   The headache is all over my head.  I have pressure in my forehead.   Pain in my neck and lower back too. The cut is not bleeding.  My husband put steri-strips on it. 2. ONSET: "When did the injury happen?" (Minutes or hours ago)      Saturday night. 3. NEUROLOGIC SYMPTOMS: "Was there any loss of consciousness?" "Are there any other neurological symptoms?"      Yes.   I don't know how long I was out.   My husband didn't know I had passed out until I woke up and called him. When husband trying to get me up I was in and out. 4. MENTAL STATUS: "Does the person know who he is, who you are, and where he is?"      Yes   Clear and appropriate.     I feel really tired today.  I had to drive home 2 hours to get home.   I know that wasn't smart.  5. LOCATION: "What part of the head was hit?"      On forehead at the hairline in the middle above my nose at the hairline. 6. SCALP APPEARANCE: "What does the scalp look like? Is it bleeding now?" If Yes, ask: "Is it difficult to stop?"      After putting ice on it the swelling went down but still a little swollen.   No black eyes. 7. SIZE: For cuts, bruises, or swelling, ask: "How large is it?" (e.g., inches or centimeters)      The cut is maybe an inch.  There's 2 small cuts near each other.   There was blood on the  floor. 8. PAIN: "Is there any pain?" If Yes, ask: "How bad is it?"  (e.g., Scale 1-10; or mild, moderate, severe)     Headache.  5-6 on pain scale.   When it happened it was a 10. I had a little change in vision I noticed while driving this morning.    The pain in my neck and lower back are worse. My ankle is hurting.    That's what started all this fall.  My knee is hurting also.  Right ankle and knee.  The right side of my neck and lower back are hurting.  My left shoulder is bruised. 9. TETANUS: For any breaks in the skin, ask: "When was the last tetanus booster?"     I am up to date.  Had it in April. 10. OTHER SYMPTOMS: "Do you have any other symptoms?" (e.g., neck pain, vomiting) I was rock climbing and fell and hurt my ankle.         I was dizzy yesterday but not today except when I first got up this morning. 11. PREGNANCY: "Is there any chance you are pregnant?" "When was your last menstrual period?"       Not asked.  Protocols used: HEAD INJURY-A-AH

## 2019-09-25 NOTE — Discharge Instructions (Signed)
Please follow up with your primary care provider this week.  Avoid screen time and limit this for 1h a day only.

## 2019-09-25 NOTE — Telephone Encounter (Signed)
FYI

## 2019-09-25 NOTE — ED Triage Notes (Signed)
Called patient's insurance company and spoke with [facility] to pre-cert CT head w/o contrast (CPT [id number]) and CT c-spine w/o contrast (CPT [phone]). Auth # [id number] valid 09/25/19 to 10/25/19.

## 2019-10-27 NOTE — ED Provider Notes (Signed)
[facility]    CSN: [id number] Arrival date & time: 09/25/19  1222      History   Chief Complaint Chief Complaint  Patient presents with   Loss of Consciousness   Headache   Neck Pain    HPI Ashley [name] is a 49 y.o. female. who presents today saying she got up to go to the bathroom between 12:30 am and 1 am. She recalls while sitting feeling nausea and light headed, and the next thing she recalls is waking up on the floor. She does not think she was out very long, maybe 15 minutes. She had spoken to her boyfriend when she got up since he was in the kitchen. She called him and he went to attend to her and found her laying on BM and she does not recall any of this. She had taken Flexeril the night before around 8-8:30 pm due to ankle injury on 7/17. Currently she is having head pain, neck pain, lumbar back pain    Past Medical History:  Diagnosis Date   Brain hemangioma ([place])    Benig in 2018, never went to get head MRI   Hypertension    Migraine     Patient Active Problem List   Diagnosis Date Noted   Varicose veins with inflammation 06/20/2019   Numbness and tingling of both feet 06/16/2019   Discoloration of skin of lower leg 06/16/2019   Meningioma ([place]) 04/22/2018   Fatigue 10/25/2015   Hypercholesterolemia 10/25/2015   BP (high blood pressure) 10/25/2015   Acute sinusitis 06/20/2015   Acute onset aura migraine 09/14/2008   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      Home Medications     Prior to Admission medications   Medication Sig Start Date End Date Taking? Authorizing Provider  albuterol (VENTOLIN HFA) 108 (90 Base) MCG/ACT inhaler Use prior to exercise 04/28/19  Yes Mar Daring, PA-C  amLODipine (NORVASC) 5 MG tablet Take 1 tablet (5 mg total) by mouth daily. 04/28/19  Yes Mar Daring, PA-C  azelastine (ASTELIN) 0.1 % nasal spray Place 1 spray into both nostrils 2 (two) times daily. Use in each nostril as directed 06/14/19  Yes Burnette, Clearnce Sorrel, PA-C  norethindrone-ethinyl estradiol (LOESTRIN) 1-20 MG-MCG tablet Take 1 tablet by mouth daily. 04/28/19  Yes Mar Daring, PA-C  triamterene-hydrochlorothiazide (MAXZIDE-25) 37.5-25 MG tablet Take 1 tablet by mouth daily. 04/28/19  Yes Fenton Malling M, PA-C  cyclobenzaprine (FLEXERIL) 10 MG tablet 1/2 to 1 tid prn muscle spasms 09/25/19   Rodriguez-Southworth, Sunday Spillers, PA-C  diclofenac (VOLTAREN) 50 MG EC tablet Take 1 tablet (50 mg total) by mouth 2 (two) times daily. For pain 09/25/19   Rodriguez-Southworth, Sunday Spillers, PA-C  phentermine (ADIPEX-P) 37.5 MG tablet Take 1 tablet (37.5 mg total) by mouth daily before breakfast. Patient not taking: Reported on 09/15/2019 05/19/19 09/25/19  Mar Daring, PA-C    Family History Family History  Problem Relation Age of Onset   Hypertension Mother    Hyperlipidemia Mother  Hypertension Father    Hyperlipidemia Father    Breast cancer Neg Hx     Social History Social History   Tobacco Use   Smoking status: Never Smoker   Smokeless tobacco: Never Used  Scientific laboratory technician Use: Never used  Substance Use Topics   Alcohol use: Yes    Alcohol/week: 0.0 standard drinks    Comment: rarely   Drug use: Never     Allergies   Ciprofloxacin, Codeine, Gabapentin, Hydrocodone-acetaminophen, Hydromorphone hcl, Oxycodone-acetaminophen, Promethazine hcl, Tramadol, and Sulfa antibiotics   Review of Systems  + for head pain, neck pain,  lumbar back pain, ankle pain. The rest of 10 point ROP is neg.   Physical Exam Triage Vital Signs ED Triage Vitals  Enc Vitals Group     BP 09/25/19 1233 (!) 136/95     Pulse Rate 09/25/19 1233 86     Resp 09/25/19 1233 18     Temp 09/25/19 1233 98.4 F (36.9 C)     Temp Source 09/25/19 1233 Oral     SpO2 09/25/19 1233 100 %     Weight 09/25/19 1233 164 lb (74.4 kg)     Height 09/25/19 1233 5' 5"  (1.651 m)     Head Circumference --      Peak Flow --      Pain Score 09/25/19 1232 6     Pain Loc --      Pain Edu? --      Excl. in [place]? --    No data found.  Updated Vital Signs BP (!) 136/95 (BP Location: Left Arm) Comment: patient hasn't taken HTN meds today   Pulse 86    Temp 98.4 F (36.9 C) (Oral)    Resp 18    Ht 5' 5"  (1.651 m)    Wt 164 lb (74.4 kg)    LMP 08/16/2019 (Exact Date) Comment: continuous OCP   SpO2 100%    BMI 27.29 kg/m   Visual Acuity Right Eye Distance:   Left Eye Distance:   Bilateral Distance:    Right Eye Near:   Left Eye Near:    Bilateral Near:      Physical Exam Vitals signs and nursing note reviewed.  Constitutional:      General: He is not in acute distress.    Appearance: He is well-developed and normal weight. He is not ill-appearing, toxic-appearing or diaphoretic.  HENT:     Head: Normocephalic. No hematomas or lumps palpated.  Eyes:     Extraocular Movements: Extraocular movements intact.     Pupils: Pupils are equal, round, and reactive to light.  Neck:     Musculoskeletal: Neck supple. Trapezius muscles are tense and tender. She also have cervical spine vertebral tenderness around C3. No neck rigidity.  Cardiovascular:     Rate and Rhythm: Normal rate and regular rhythm.     Heart sounds: No murmur.  Pulmonary:     Effort: Pulmonary effort is normal.     Breath sounds: Normal breath sounds. No wheezing, rhonchi or rales.  Abdominal:     General: Bowel sounds are normal.     Palpations: Abdomen is soft. There is no mass.      Tenderness: There is no abdominal tenderness. There is no guarding.  Musculoskeletal: Normal range of motion.  Lymphadenopathy:     Cervical: No cervical adenopathy.  Skin:    General: Skin is warm and dry.  Neurological:     Mental Status: He is alert.  Cranial Nerves: No cranial nerve deficit or facial asymmetry.     Sensory: No sensory deficit.     Motor: No weakness.     Coordination: Romberg sign negative. Coordination normal.     Gait: Gait normal.     Deep Tendon Reflexes: Reflexes normal.     Comments: Normal Romberg, propioception, finger to nose, tandem gait is a little off balance.   Psychiatric:        Mood and Affect: Mood normal.        Speech: Speech normal.        Behavior: Behavior normal.     UC Treatments / Results  Labs (all labs ordered are listed, but only abnormal results are displayed) Labs Reviewed - No data to display  EKG   Radiology CT HEAD WITHOUT CONTRAST  CT CERVICAL SPINE WITHOUT CONTRAST  TECHNIQUE: Multidetector CT imaging of the head and cervical spine was performed following the standard protocol without intravenous contrast. Multiplanar CT image reconstructions of the cervical spine were also generated.  COMPARISON:  CT head 12/16/2016  FINDINGS: CT HEAD FINDINGS  Brain: No evidence of acute infarction, hemorrhage, hydrocephalus, extra-axial collection. There is again a small left frontal dural base calcification (series 3, image 40) which has slightly increased in size now measuring 0.9 x 0.5 cm, previously measuring 0.7 x 0.4 cm, likely a small meningioma.  Vascular: No hyperdense vessel or unexpected calcification.  Skull: Normal. Negative for fracture or focal lesion.  Sinuses/Orbits: No acute finding.  Other: None.  CT CERVICAL SPINE FINDINGS  Alignment: Normal.  Skull base and vertebrae: No acute fracture. No primary bone lesion or focal pathologic process.  Soft tissues and spinal canal: No  prevertebral fluid or swelling. No visible canal hematoma.  Disc levels: Mild degenerative disc changes in the mid cervical spine.  Upper chest: Negative.  Other: None.  IMPRESSION: 1. No acute intracranial abnormality. 2. No acute fracture or subluxation of the cervical spine.   Electronically Signed   By: Audie Pinto M.D.   On: 09/25/2019 14:29  Procedures Procedures (including critical care time)  Medications Ordered in UC Medications - No data to display  Initial Impression / Assessment and Plan / UC Course  I have reviewed the triage vital signs and the nursing notes. Pertinent labs & imaging results that were available during my care of the patient were reviewed by me and considered in my medical decision making (see chart for details). She has whip lash and lumbar strain for which I gave her Flexeril and Voltaren. See instructions.  Final Clinical Impressions(s) / UC Diagnoses   Final diagnoses:  Syncope and collapse  Neck strain, initial encounter  Contusion of scalp, initial encounter  Back strain, initial encounter     Discharge Instructions     Please follow up with your primary care provider this week.  Avoid screen time and limit this for 1h a day only.     ED Prescriptions    Medication Sig Dispense Auth. Provider   cyclobenzaprine (FLEXERIL) 10 MG tablet 1/2 to 1 tid prn muscle spasms 20 tablet Rodriguez-Southworth, Sunday Spillers, PA-C   diclofenac (VOLTAREN) 50 MG EC tablet Take 1 tablet (50 mg total) by mouth 2 (two) times daily. For pain 30 tablet Rodriguez-Southworth, Sunday Spillers, PA-C     PDMP not reviewed this encounter.   Shelby Mattocks, PA-C 10/27/19 0830

## 2019-11-10 ENCOUNTER — Other Ambulatory Visit (INDEPENDENT_AMBULATORY_CARE_PROVIDER_SITE_OTHER): Payer: No Typology Code available for payment source | Admitting: Vascular Surgery

## 2019-11-14 ENCOUNTER — Encounter (INDEPENDENT_AMBULATORY_CARE_PROVIDER_SITE_OTHER): Payer: No Typology Code available for payment source

## 2019-12-01 ENCOUNTER — Other Ambulatory Visit (INDEPENDENT_AMBULATORY_CARE_PROVIDER_SITE_OTHER): Payer: No Typology Code available for payment source | Admitting: Vascular Surgery

## 2019-12-04 ENCOUNTER — Encounter: Payer: Self-pay | Admitting: Physician Assistant

## 2019-12-04 ENCOUNTER — Encounter (INDEPENDENT_AMBULATORY_CARE_PROVIDER_SITE_OTHER): Payer: No Typology Code available for payment source

## 2019-12-06 NOTE — Progress Notes (Signed)
Established patient visit   Patient: Ashley [name]   DOB: 1971/02/16   49 y.o. Female  MRN: [id number] Visit Date: 12/08/2019  Today's healthcare provider: Mar Daring, PA-C   Chief Complaint  Patient presents with  . Neck Pain   Subjective    HPI  Patient with c/o right side neck pain and headache from when she hit her head back in July.  Reports that when she presses on a certain spot in her posterior neck it makes her lightheaded and dizzy. She was having pain when she would turn her head, but yesterday she was able to turn her head better. She had a massage and feels that is a little better. She is not taking the Flexeril. Flexeril caused her to pass out in the middle of the night when she had gotten up to use the restroom after taking the flexeril right before she went to sleep. This was the incident where she hit her head. She did seek emergent evaluation and did have a CT of her head and neck. Both were unremarkable. Notes are available in Epic.   Patient Active Problem List   Diagnosis Date Noted  . Varicose veins with inflammation 06/20/2019  . Numbness and tingling of both feet 06/16/2019  . Discoloration of skin of lower leg 06/16/2019  . Meningioma ([place]) 04/22/2018  . Fatigue 10/25/2015  . Hypercholesterolemia 10/25/2015  . BP (high blood pressure) 10/25/2015  . Acute sinusitis 06/20/2015  . Acute onset aura migraine 09/14/2008  . 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 Malaise 12/09/2007  . Allergic rhinitis 07/05/2007   Past Medical History:  Diagnosis Date  . Brain hemangioma ([place])    Benig in 2018, never went to get head MRI  . Hypertension   . Migraine        Medications: Outpatient Medications Prior to Visit  Medication Sig  . albuterol (VENTOLIN HFA) 108 (90 Base) MCG/ACT inhaler Use prior to exercise  . amLODipine (NORVASC) 5 MG tablet Take 1 tablet (5 mg total) by mouth daily.  Marland Kitchen azelastine (ASTELIN) 0.1 % nasal spray Place 1 spray  into both nostrils 2 (two) times daily. Use in each nostril as directed  . norethindrone-ethinyl estradiol (LOESTRIN) 1-20 MG-MCG tablet Take 1 tablet by mouth daily.  Marland Kitchen triamterene-hydrochlorothiazide (MAXZIDE-25) 37.5-25 MG tablet Take 1 tablet by mouth daily.  . cyclobenzaprine (FLEXERIL) 10 MG tablet 1/2 to 1 tid prn muscle spasms (Patient not taking: Reported on 12/08/2019)  . diclofenac (VOLTAREN) 50 MG EC tablet Take 1 tablet (50 mg total) by mouth 2 (two) times daily. For pain   No facility-administered medications prior to visit.    Review of Systems  Constitutional: Negative.   Respiratory: Negative.   Cardiovascular: Negative.   Gastrointestinal: Negative.   Musculoskeletal: Positive for myalgias, neck pain and neck stiffness.  Neurological: Positive for dizziness and headaches.    Last CBC Lab Results  Component Value Date   WBC 9.9 05/19/2019   HGB 15.2 05/19/2019   HCT 43.9 05/19/2019   MCV 88 05/19/2019   MCH 30.3 05/19/2019   RDW 12.0 05/19/2019   PLT 353 84/53/6468   Last metabolic panel Lab Results  Component Value Date   GLUCOSE 86 05/19/2019   NA 139 05/19/2019   K 4.0 05/19/2019   CL 100 05/19/2019   CO2 22 05/19/2019   BUN 13 05/19/2019   CREATININE 1.01 (H) 05/19/2019   GFRNONAA 65 05/19/2019   GFRAA 75 05/19/2019   CALCIUM  9.5 05/19/2019   PROT 7.6 05/19/2019   ALBUMIN 4.3 05/19/2019   LABGLOB 3.3 05/19/2019   AGRATIO 1.3 05/19/2019   BILITOT 0.5 05/19/2019   ALKPHOS 72 05/19/2019   AST 14 05/19/2019   ALT 18 05/19/2019   ANIONGAP 7 07/20/2014      Objective    BP 136/90 (BP Location: Left Arm, Patient Position: Sitting, Cuff Size: Large)   Temp 98.2 F (36.8 C) (Oral)   Resp 16   Wt 164 lb (74.4 kg)   BMI 27.29 kg/m  BP Readings from Last 3 Encounters:  12/08/19 136/90  09/25/19 (!) 136/95  09/15/19 139/90   Wt Readings from Last 3 Encounters:  12/08/19 164 lb (74.4 kg)  09/25/19 164 lb (74.4 kg)  09/15/19 164 lb (74.4 kg)        Physical Exam Vitals reviewed.  Constitutional:      General: She is not in acute distress.    Appearance: Normal appearance. She is well-developed and normal weight. She is not ill-appearing or diaphoretic.  HENT:     Head: Normocephalic and atraumatic.  Eyes:     General: No scleral icterus.    Extraocular Movements: Extraocular movements intact.     Pupils: Pupils are equal, round, and reactive to light.  Neck:     Thyroid: No thyromegaly.     Vascular: No JVD.     Trachea: No tracheal deviation.   Cardiovascular:     Rate and Rhythm: Normal rate and regular rhythm.     Heart sounds: Normal heart sounds. No murmur heard.  No friction rub. No gallop.   Pulmonary:     Effort: Pulmonary effort is normal. No respiratory distress.     Breath sounds: Normal breath sounds. No wheezing or rales.  Musculoskeletal:     Cervical back: Neck supple. Tenderness present. No torticollis. Pain with movement and muscular tenderness present. No spinous process tenderness. Decreased range of motion.  Lymphadenopathy:     Cervical: No cervical adenopathy.  Neurological:     Mental Status: She is alert.       No results found for any visits on 12/08/19.  Assessment & Plan     1. Occipital neuralgia of right side Suspect Occipital neuralgia secondary to whiplash injury and muscle spasm from fall in July. Massage has helped some, so I have encouraged to continue regular massages. Discussed PT, will await at this time. Will give medrol dose pak and baclofen as below. Moist heat. Stretches and light exercise. Call if not improving and may need to refer to Neurology. She agrees.  - methylPREDNISolone (MEDROL) 4 MG TBPK tablet; 6 day taper; take as directed on package instructions  Dispense: 21 tablet; Refill: 0 - baclofen (LIORESAL) 10 MG tablet; Take 1 tablet (10 mg total) by mouth 3 (three) times daily.  Dispense: 30 each; Refill: 0   No follow-ups on file.      Reynolds Bowl, PA-C, have reviewed all documentation for this visit. The documentation on 12/14/19 for the exam, diagnosis, procedures, and orders are all accurate and complete.   Rubye Beach  [facility] [phone] (phone) [phone] (fax)  [place]

## 2019-12-08 ENCOUNTER — Other Ambulatory Visit: Payer: Self-pay

## 2019-12-08 ENCOUNTER — Other Ambulatory Visit: Payer: Self-pay | Admitting: Physician Assistant

## 2019-12-08 ENCOUNTER — Ambulatory Visit (INDEPENDENT_AMBULATORY_CARE_PROVIDER_SITE_OTHER): Payer: No Typology Code available for payment source | Admitting: Physician Assistant

## 2019-12-08 ENCOUNTER — Encounter: Payer: Self-pay | Admitting: Physician Assistant

## 2019-12-08 VITALS — BP 136/90 | Temp 98.2°F | Resp 16 | Wt 164.0 lb

## 2019-12-08 DIAGNOSIS — M5481 Occipital neuralgia: Secondary | ICD-10-CM

## 2019-12-08 MED ORDER — BACLOFEN 10 MG PO TABS: 10.0000 mg | ORAL_TABLET | Freq: Three times a day (TID) | ORAL | 0 refills | Status: DC

## 2019-12-08 MED ORDER — METHYLPREDNISOLONE 4 MG PO TBPK: ORAL_TABLET | ORAL | 0 refills | Status: DC

## 2019-12-08 NOTE — Patient Instructions (Signed)
Occipital Neuralgia  Occipital neuralgia is a type of headache that causes brief episodes of very bad pain in the back of your head. Pain from occipital neuralgia may spread (radiate) to other parts of your head. These headaches may be caused by irritation of the nerves that leave your spinal cord high up in your neck, just below the base of your skull (occipital nerves). Your occipital nerves transmit sensations from the back of your head, the top of your head, and the areas behind your ears. What are the causes? This condition can occur without any known cause (primary headache syndrome). In other cases, this condition is caused by pressure on or irritation of one of the two occipital nerves. Pressure and irritation may be due to:  Muscle spasm in the neck.  Neck injury.  Wear and tear of the vertebrae in the neck (osteoarthritis).  Disease of the disks that separate the vertebrae.  Swollen blood vessels that put pressure on the occipital nerves.  Infections.  Tumors.  Diabetes. What are the signs or symptoms? This condition causes brief burning, stabbing, electric, shocking, or shooting pain which can radiate to the top of the head. It can happen on one side or both sides of the head. It can also cause:  Pain behind the eye.  Pain triggered by neck movement or hair brushing.  Scalp tenderness.  Aching in the back of the head between episodes of very bad pain.  Pain gets worse with exposure to bright lights. How is this diagnosed? There is no test that diagnoses this condition. Your health care provider may diagnose this condition based on a physical exam and your symptoms. Other tests may be done, such as:  Imaging studies of the brain and neck (cervical spine), such as an MRI or CT scan. These look for causes of pinched nerves.  Applying pressure to the nerves in the neck to try to re-create the pain.  Injection of numbing medicine into the occipital nerve areas to see if  pain goes away (diagnostic nerve block). How is this treated? Treatment for this condition may begin with simple measures, such as:  Rest.  Massage.  Applying heat or cold on the area.  Over-the-counter pain relievers. If these measures do not work, you may need other treatments, including:  Medicines, such as: ? Prescription-strength anti-inflammatory medicines. ? Muscle relaxants. ? Anti-seizure medicines, which can relieve pain. ? Antidepressants, which can relieve pain. ? Injected medicines, such as medicines that numb the area (local anesthetic) and steroids.  Pulsed radiofrequency ablation. This is when wires are implanted to deliver electrical impulses that block pain signals from the occipital nerve.  Surgery to relieve nerve pressure.  Physical therapy. Follow these instructions at home: Pain management      Avoid any activities that cause pain.  Rest when you have an attack of pain.  Try gentle massage to relieve pain.  Try a different pillow or sleeping position.  If directed, apply heat to the affected area as told by your health care provider. Use the heat source that your health care provider recommends, such as a moist heat pack or a heating pad. ? Place a towel between your skin and the heat source. ? Leave the heat on for 20-30 minutes. ? Remove the heat if your skin turns bright red. This is especially important if you are unable to feel pain, heat, or cold. You may have a greater risk of getting burned.  If directed, apply ice to the  back of the head and neck area as told by your health care provider. ? Put ice in a plastic bag. ? Place a towel between your skin and the bag. ? Leave the ice on for 20 minutes, 2-3 times per day. General instructions  Take over-the-counter and prescription medicines only as told by your health care provider.  Avoid things that make your symptoms worse, such as bright lights.  Try to stay active. Get regular  exercise that does not cause pain. Ask your health care provider to suggest safe exercises for you.  Work with a physical therapist to learn stretching exercises you can do at home.  Practice good posture.  Keep all follow-up visits as told by your health care provider. This is important. Contact a health care provider if:  Your medicine is not working.  You have new or worsening symptoms. Get help right away if:  You have very bad head pain that does not go away.  You have a sudden change in vision, balance, or speech. Summary  Occipital neuralgia is a type of headache that causes brief episodes of very bad pain in the back of your head.  Pain from occipital neuralgia may spread (radiate) to other parts of your head.  Treatment for this condition includes rest, massage, and medicines. This information is not intended to replace advice given to you by your health care provider. Make sure you discuss any questions you have with your health care provider. Document Revised: 02/09/2017 Document Reviewed: 04/30/2016 Elsevier Patient Education  [place].

## 2019-12-15 ENCOUNTER — Other Ambulatory Visit (INDEPENDENT_AMBULATORY_CARE_PROVIDER_SITE_OTHER): Payer: No Typology Code available for payment source | Admitting: Vascular Surgery

## 2019-12-18 ENCOUNTER — Encounter (INDEPENDENT_AMBULATORY_CARE_PROVIDER_SITE_OTHER): Payer: No Typology Code available for payment source

## 2019-12-27 ENCOUNTER — Encounter: Payer: Self-pay | Admitting: Physician Assistant

## 2019-12-27 DIAGNOSIS — M5481 Occipital neuralgia: Secondary | ICD-10-CM

## 2019-12-28 NOTE — Addendum Note (Signed)
Addended by: Mar Daring on: 12/28/2019 06:18 PM   Modules accepted: Orders

## 2020-02-16 ENCOUNTER — Other Ambulatory Visit: Payer: Self-pay | Admitting: Neurology

## 2020-04-22 ENCOUNTER — Other Ambulatory Visit: Payer: Self-pay | Admitting: Physician Assistant

## 2020-04-22 DIAGNOSIS — R921 Mammographic calcification found on diagnostic imaging of breast: Secondary | ICD-10-CM

## 2020-04-22 DIAGNOSIS — Z1231 Encounter for screening mammogram for malignant neoplasm of breast: Secondary | ICD-10-CM

## 2020-05-24 ENCOUNTER — Ambulatory Visit (INDEPENDENT_AMBULATORY_CARE_PROVIDER_SITE_OTHER): Payer: No Typology Code available for payment source | Admitting: Physician Assistant

## 2020-05-24 ENCOUNTER — Encounter: Payer: Self-pay | Admitting: Physician Assistant

## 2020-05-24 ENCOUNTER — Ambulatory Visit
Admission: RE | Admit: 2020-05-24 | Discharge: 2020-05-24 | Disposition: A | Discharge status: Home / Self Care | Payer: No Typology Code available for payment source | Source: Ambulatory Visit | Attending: Physician Assistant | Admitting: Physician Assistant

## 2020-05-24 ENCOUNTER — Other Ambulatory Visit: Payer: Self-pay | Admitting: Physician Assistant

## 2020-05-24 ENCOUNTER — Other Ambulatory Visit: Payer: Self-pay

## 2020-05-24 VITALS — BP 141/93 | HR 86 | Temp 98.6°F | Ht 65.0 in | Wt 180.0 lb

## 2020-05-24 DIAGNOSIS — J45990 Exercise induced bronchospasm: Secondary | ICD-10-CM | POA: Diagnosis not present

## 2020-05-24 DIAGNOSIS — R5382 Chronic fatigue, unspecified: Secondary | ICD-10-CM | POA: Diagnosis not present

## 2020-05-24 DIAGNOSIS — Z3041 Encounter for surveillance of contraceptive pills: Secondary | ICD-10-CM | POA: Diagnosis not present

## 2020-05-24 DIAGNOSIS — Z1231 Encounter for screening mammogram for malignant neoplasm of breast: Secondary | ICD-10-CM | POA: Insufficient documentation

## 2020-05-24 DIAGNOSIS — I10 Essential (primary) hypertension: Secondary | ICD-10-CM

## 2020-05-24 DIAGNOSIS — R921 Mammographic calcification found on diagnostic imaging of breast: Secondary | ICD-10-CM | POA: Diagnosis not present

## 2020-05-24 DIAGNOSIS — Z0000 Encounter for general adult medical examination without abnormal findings: Secondary | ICD-10-CM | POA: Diagnosis not present

## 2020-05-24 DIAGNOSIS — D329 Benign neoplasm of meninges, unspecified: Secondary | ICD-10-CM

## 2020-05-24 DIAGNOSIS — Z6829 Body mass index (BMI) 29.0-29.9, adult: Secondary | ICD-10-CM

## 2020-05-24 MED ORDER — TRIAMTERENE-HCTZ 37.5-25 MG PO TABS: 1.0000 | ORAL_TABLET | Freq: Every day | ORAL | 3 refills | Status: DC

## 2020-05-24 MED ORDER — ALBUTEROL SULFATE HFA 108 (90 BASE) MCG/ACT IN AERS: INHALATION_SPRAY | RESPIRATORY_TRACT | 0 refills | Status: DC

## 2020-05-24 MED ORDER — NORETHINDRONE ACET-ETHINYL EST 1-20 MG-MCG PO TABS
1.0000 | ORAL_TABLET | Freq: Every day | ORAL | 4 refills | Status: DC
Start: 2020-05-24 — End: 2020-05-24

## 2020-05-24 MED ORDER — AMLODIPINE BESYLATE 5 MG PO TABS: 5.0000 mg | ORAL_TABLET | Freq: Every day | ORAL | 3 refills | Status: DC

## 2020-05-24 NOTE — Progress Notes (Signed)
Complete physical exam   Patient: Ashley [name]   DOB: 1970/09/06   50 y.o. Female  MRN: [id number] Visit Date: 05/24/2020  Today's healthcare provider: Mar Daring, PA-C   Chief Complaint  Patient presents with  . Annual Exam   Subjective    Ashley [name] is a 50 y.o. female who presents today for a complete physical exam.  She reports consuming a general diet. Exercises regularly. She generally feels well. She reports sleeping fairly well. She does have additional problems to discuss today.  HPI  Feeling more fatigued than usual.  Past Medical History:  Diagnosis Date  . Brain hemangioma ([place])    [place] in 2018, never went to get head MRI  . Hypertension   . Migraine    Past Surgical History:  Procedure Laterality Date  . BREAST BIOPSY Right 06/03/2018   X clip stereo bx CALCIUM OXALATE CRYSTALS WITHIN APOCRINE MICROCYSTS USUAL DUCTAL HYPERPLASIA, COLUMNAR CELL CHANGE, AND FOCAL PSEUDOANGIOMATOUS STROMAL HYPERPLASIA ([place]  . BREAST CYST ASPIRATION Right 10+ yrs ago  . CHOLECYSTECTOMY    . FOOT SURGERY     Social History   Socioeconomic History  . Marital status: Single    Spouse name: Not on file  . Number of children: Not on file  . Years of education: Not on file  . Highest education level: Not on file  Occupational History  . Not on file  Tobacco Use  . Smoking status: Never Smoker  . Smokeless tobacco: Never Used  Vaping Use  . Vaping Use: Never used  Substance and Sexual Activity  . Alcohol use: Yes    Alcohol/week: 0.0 standard drinks    Comment: rarely  . Drug use: Never  . Sexual activity: Not on file  Other Topics Concern  . Not on file  Social History Narrative  . Not on file   Social Determinants of Health   Financial Resource Strain: Not on file  Food Insecurity: Not on file  Transportation Needs: Not on file  Physical Activity: Not on file  Stress: Not on file  Social Connections: Not on file  Intimate Partner  Violence: Not on file   Family Status  Relation Name Status  . Mother  Alive  . Father  Deceased  . Sister  Deceased  . Brother  Alive  . MGM  Deceased  . MGF  Deceased  . PGM  Deceased  . PGF  Deceased  . Sister  Alive  . Sister  Alive  . Brother  Alive  . Brother  Alive  . Neg Hx  (Not Specified)   Family History  Problem Relation Age of Onset  . Hypertension Mother   . Hyperlipidemia Mother   . Hypertension Father   . Hyperlipidemia Father   . Diabetes Mellitus I Sister   . Diabetes Mellitus I Brother   . Diabetes Mellitus II Paternal Grandfather   . Healthy Sister   . Healthy Sister   . Healthy Brother   . Healthy Brother   . Breast cancer Neg Hx   . Colon cancer Neg Hx   . Ovarian cancer Neg Hx    Allergies  Allergen Reactions  . Ciprofloxacin Hives  . Codeine Nausea And Vomiting  . Gabapentin Nausea And Vomiting  . Hydrocodone-Acetaminophen Nausea And Vomiting  . Hydromorphone Hcl Nausea And Vomiting  . Oxycodone-Acetaminophen Nausea And Vomiting  . Promethazine Hcl Nausea And Vomiting  . Tramadol Nausea And Vomiting  . Sulfa Antibiotics Rash  hives    Patient Care Team: Mar Daring, PA-C as PCP - General (Family Medicine)   Medications: Outpatient Medications Prior to Visit  Medication Sig  . azelastine (ASTELIN) 0.1 % nasal spray Place 1 spray into both nostrils 2 (two) times daily. Use in each nostril as directed  . baclofen (LIORESAL) 10 MG tablet Take 1 tablet (10 mg total) by mouth 3 (three) times daily.  . methylPREDNISolone (MEDROL) 4 MG TBPK tablet 6 day taper; take as directed on package instructions  . [DISCONTINUED] albuterol (VENTOLIN HFA) 108 (90 Base) MCG/ACT inhaler Use prior to exercise  . [DISCONTINUED] amLODipine (NORVASC) 5 MG tablet Take 1 tablet (5 mg total) by mouth daily.  . [DISCONTINUED] norethindrone-ethinyl estradiol (LOESTRIN) 1-20 MG-MCG tablet Take 1 tablet by mouth daily.  . [DISCONTINUED]  triamterene-hydrochlorothiazide (MAXZIDE-25) 37.5-25 MG tablet Take 1 tablet by mouth daily.   No facility-administered medications prior to visit.    Review of Systems  Constitutional: Positive for fatigue (Feeling more fatigued than usual.). Negative for activity change, appetite change, chills, diaphoresis, fever and unexpected weight change.  HENT: Negative.   Eyes: Negative.   Respiratory: Negative.   Cardiovascular: Negative.   Gastrointestinal: Negative.   Endocrine: Negative.   Genitourinary: Negative.   Musculoskeletal: Negative.   Skin: Negative.   Allergic/Immunologic: Negative.   Neurological: Negative.   Hematological: Negative.   Psychiatric/Behavioral: Negative.       Objective    BP (!) 141/93 (BP Location: Left Arm, Patient Position: Sitting, Cuff Size: Large)   Pulse 86   Temp 98.6 F (37 C) (Oral)   Ht 5' 5"  (1.651 m)   Wt 180 lb (81.6 kg)   BMI 29.95 kg/m    Physical Exam Vitals reviewed.  Constitutional:      General: She is not in acute distress.    Appearance: Normal appearance. She is well-developed, well-groomed and overweight. She is not ill-appearing or diaphoretic.  HENT:     Head: Normocephalic and atraumatic.     Right Ear: Tympanic membrane, ear canal and external ear normal.     Left Ear: Tympanic membrane, ear canal and external ear normal.     Nose: Nose normal.     Mouth/Throat:     Mouth: Mucous membranes are moist.     Pharynx: Oropharynx is clear. No oropharyngeal exudate or posterior oropharyngeal erythema.  Eyes:     General: No scleral icterus.       Right eye: No discharge.        Left eye: No discharge.     Extraocular Movements: Extraocular movements intact.     Conjunctiva/sclera: Conjunctivae normal.     Pupils: Pupils are equal, round, and reactive to light.  Neck:     Thyroid: No thyromegaly.     Vascular: No carotid bruit or JVD.     Trachea: No tracheal deviation.  Cardiovascular:     Rate and Rhythm: Normal  rate and regular rhythm.     Pulses: Normal pulses.     Heart sounds: Normal heart sounds. No murmur heard. No friction rub. No gallop.   Pulmonary:     Effort: Pulmonary effort is normal. No respiratory distress.     Breath sounds: Normal breath sounds. No wheezing or rales.  Chest:     Chest wall: No tenderness.  Abdominal:     General: Abdomen is flat. Bowel sounds are normal. There is no distension.     Palpations: Abdomen is soft. There is no mass.  Tenderness: There is no abdominal tenderness. There is no guarding or rebound.  Musculoskeletal:        General: No tenderness. Normal range of motion.     Cervical back: Normal range of motion and neck supple. No tenderness.     Right lower leg: No edema.     Left lower leg: No edema.  Lymphadenopathy:     Cervical: No cervical adenopathy.  Skin:    General: Skin is warm and dry.     Capillary Refill: Capillary refill takes less than 2 seconds.     Findings: No rash.  Neurological:     General: No focal deficit present.     Mental Status: She is alert and oriented to person, place, and time. Mental status is at baseline.  Psychiatric:        Mood and Affect: Mood normal.        Behavior: Behavior normal. Behavior is cooperative.        Thought Content: Thought content normal.        Judgment: Judgment normal.     Last depression screening scores PHQ 2/9 Scores 05/24/2020 05/19/2019 04/28/2019  PHQ - 2 Score 0 0 0  PHQ- 9 Score 0 - -   Last fall risk screening Fall Risk  05/24/2020  Falls in the past year? 0  Number falls in past yr: 0  Injury with Fall? 0  Risk for fall due to : No Fall Risks  Follow up Falls evaluation completed   Last Audit-C alcohol use screening Alcohol Use Disorder Test (AUDIT) 05/24/2020  1. How often do you have a drink containing alcohol? 0  2. How many drinks containing alcohol do you have on a typical day when you are drinking? 0  3. How often do you have six or more drinks on one  occasion? 0  AUDIT-C Score 0  Alcohol Brief Interventions/Follow-up AUDIT Score <7 follow-up not indicated   A score of 3 or more in women, and 4 or more in men indicates increased risk for alcohol abuse, EXCEPT if all of the points are from question 1   No results found for any visits on 05/24/20.  Assessment & Plan    Routine Health Maintenance and Physical Exam  Exercise Activities and Dietary recommendations Goals   None     Immunization History  Administered Date(s) Administered  . Influenza-Unspecified 11/17/2016, 12/22/2017  . Tdap 06/19/2019    Health Maintenance  Topic Date Due  . Hepatitis C Screening  Never done  . INFLUENZA VACCINE  07/10/2020 (Originally 10/08/2019)  . Fecal DNA (Cologuard)  05/14/2021  . MAMMOGRAM  05/18/2021  . PAP SMEAR-Modifier  12/27/2021  . TETANUS/TDAP  06/18/2029  . HIV Screening  Completed  . HPV VACCINES  Aged Out    Discussed health benefits of physical activity, and encouraged her to engage in regular exercise appropriate for her age and condition.  1. Annual physical exam Normal physical exam today. Will check labs as below and f/u pending lab results. If labs are stable and WNL she will not need to have these rechecked for one year at her next annual physical exam. She is to call the office in the meantime if she has any acute issue, questions or concerns.  2. Essential hypertension Stable. Diagnosis pulled for medication refill. Continue current medical treatment plan. Will check labs as below and f/u pending results. - triamterene-hydrochlorothiazide (MAXZIDE-25) 37.5-25 MG tablet; Take 1 tablet by mouth daily.  Dispense: 90 tablet; Refill:  3 - amLODipine (NORVASC) 5 MG tablet; Take 1 tablet (5 mg total) by mouth daily.  Dispense: 90 tablet; Refill: 3 - CBC w/Diff/Platelet - Comprehensive Metabolic Panel (CMET) - Lipid Panel With LDL/HDL Ratio - TSH - HgB A1c  3. Exercise-induced asthma Stable. Diagnosis pulled for  medication refill. Continue current medical treatment plan. - albuterol (VENTOLIN HFA) 108 (90 Base) MCG/ACT inhaler; Use prior to exercise  Dispense: 18 g; Refill: 0  4. Encounter for surveillance of contraceptive pills Stable. Diagnosis pulled for medication refill. Continue current medical treatment plan. - norethindrone-ethinyl estradiol (LOESTRIN) 1-20 MG-MCG tablet; Take 1 tablet by mouth daily.  Dispense: 84 tablet; Refill: 4  5. BMI 29.0-29.9,adult Counseled patient on healthy lifestyle modifications including dieting and exercise.  Will check labs as below and f/u pending results. - CBC w/Diff/Platelet - Comprehensive Metabolic Panel (CMET) - Lipid Panel With LDL/HDL Ratio - TSH - HgB A1c - Vitamin D (25 hydroxy) - B12 and Folate Panel  6. Chronic fatigue Will check labs as below and f/u pending results. If labs are normal, will do a trial off contraception to check hormonal labs for menopause.  - CBC w/Diff/Platelet - TSH - HgB A1c - Vitamin D (25 hydroxy) - B12 and Folate Panel  7. Meningioma ([facility]) Stable. Followed by Neurology, Dr. Manuella Ghazi.    No follow-ups on file.     Reynolds Bowl, PA-C, have reviewed all documentation for this visit. The documentation on 05/24/20 for the exam, diagnosis, procedures, and orders are all accurate and complete.   Rubye Beach  [facility] [phone] (phone) [phone] (fax)  [place]

## 2020-05-24 NOTE — Patient Instructions (Signed)
Preventive Care 84-52 Years Old, Female Preventive care refers to lifestyle choices and visits with your health care provider that can promote health and wellness. This includes:  A yearly physical exam. This is also called an annual wellness visit.  Regular dental and eye exams.  Immunizations.  Screening for certain conditions.  Healthy lifestyle choices, such as: ? Eating a healthy diet. ? Getting regular exercise. ? Not using drugs or products that contain nicotine and tobacco. ? Limiting alcohol use. What can I expect for my preventive care visit? Physical exam Your health care provider will check your:  Height and weight. These may be used to calculate your BMI (body mass index). BMI is a measurement that tells if you are at a healthy weight.  Heart rate and blood pressure.  Body temperature.  Skin for abnormal spots. Counseling Your health care provider may ask you questions about your:  Past medical problems.  Family's medical history.  Alcohol, tobacco, and drug use.  Emotional well-being.  Home life and relationship well-being.  Sexual activity.  Diet, exercise, and sleep habits.  Work and work Statistician.  Access to firearms.  Method of birth control.  Menstrual cycle.  Pregnancy history. What immunizations do I need? Vaccines are usually given at various ages, according to a schedule. Your health care provider will recommend vaccines for you based on your age, medical history, and lifestyle or other factors, such as travel or where you work.   What tests do I need? Blood tests  Lipid and cholesterol levels. These may be checked every 5 years, or more often if you are over 3 years old.  Hepatitis C test.  Hepatitis B test. Screening  Lung cancer screening. You may have this screening every year starting at age 73 if you have a 30-pack-year history of smoking and currently smoke or have quit within the past 15 years.  Colorectal cancer  screening. ? All adults should have this screening starting at age 52 and continuing until age 17. ? Your health care provider may recommend screening at age 49 if you are at increased risk. ? You will have tests every 1-10 years, depending on your results and the type of screening test.  Diabetes screening. ? This is done by checking your blood sugar (glucose) after you have not eaten for a while (fasting). ? You may have this done every 1-3 years.  Mammogram. ? This may be done every 1-2 years. ? Talk with your health care provider about when you should start having regular mammograms. This may depend on whether you have a family history of breast cancer.  BRCA-related cancer screening. This may be done if you have a family history of breast, ovarian, tubal, or peritoneal cancers.  Pelvic exam and Pap test. ? This may be done every 3 years starting at age 10. ? Starting at age 11, this may be done every 5 years if you have a Pap test in combination with an HPV test. Other tests  STD (sexually transmitted disease) testing, if you are at risk.  Bone density scan. This is done to screen for osteoporosis. You may have this scan if you are at high risk for osteoporosis. Talk with your health care provider about your test results, treatment options, and if necessary, the need for more tests. Follow these instructions at home: Eating and drinking  Eat a diet that includes fresh fruits and vegetables, whole grains, lean protein, and low-fat dairy products.  Take vitamin and mineral supplements  as recommended by your health care provider.  Do not drink alcohol if: ? Your health care provider tells you not to drink. ? You are pregnant, may be pregnant, or are planning to become pregnant.  If you drink alcohol: ? Limit how much you have to 0-1 drink a day. ? Be aware of how much alcohol is in your drink. In the [place] one drink equals one 12 oz bottle of beer (355 mL), one 5 oz glass of  wine (148 mL), or one 1 oz glass of hard liquor (44 mL).   Lifestyle  Take daily care of your teeth and gums. Brush your teeth every morning and night with fluoride toothpaste. Floss one time each day.  Stay active. Exercise for at least 30 minutes 5 or more days each week.  Do not use any products that contain nicotine or tobacco, such as cigarettes, e-cigarettes, and chewing tobacco. If you need help quitting, ask your health care provider.  Do not use drugs.  If you are sexually active, practice safe sex. Use a condom or other form of protection to prevent STIs (sexually transmitted infections).  If you do not wish to become pregnant, use a form of birth control. If you plan to become pregnant, see your health care provider for a prepregnancy visit.  If told by your health care provider, take low-dose aspirin daily starting at age 50.  Find healthy ways to cope with stress, such as: ? Meditation, yoga, or listening to music. ? Journaling. ? Talking to a trusted person. ? Spending time with friends and family. Safety  Always wear your seat belt while driving or riding in a vehicle.  Do not drive: ? If you have been drinking alcohol. Do not ride with someone who has been drinking. ? When you are tired or distracted. ? While texting.  Wear a helmet and other protective equipment during sports activities.  If you have firearms in your house, make sure you follow all gun safety procedures. What's next?  Visit your health care provider once a year for an annual wellness visit.  Ask your health care provider how often you should have your eyes and teeth checked.  Stay up to date on all vaccines. This information is not intended to replace advice given to you by your health care provider. Make sure you discuss any questions you have with your health care provider. Document Revised: 11/28/2019 Document Reviewed: 11/04/2017 Elsevier Patient Education  2021 [employer]  

## 2020-05-25 LAB — COMPREHENSIVE METABOLIC PANEL
ALT: 30 IU/L (ref 0–32)
AST: 19 IU/L (ref 0–40)
Albumin/Globulin Ratio: 1.7 (ref 1.2–2.2)
Albumin: 4.3 g/dL (ref 3.8–4.9)
Alkaline Phosphatase: 71 IU/L (ref 44–121)
BUN/Creatinine Ratio: 15 (ref 9–23)
BUN: 13 mg/dL (ref 6–24)
Bilirubin Total: 0.3 mg/dL (ref 0.0–1.2)
CO2: 23 mmol/L (ref 20–29)
Calcium: 9.2 mg/dL (ref 8.7–10.2)
Chloride: 102 mmol/L (ref 96–106)
Creatinine, Ser: 0.84 mg/dL (ref 0.57–1.00)
Globulin, Total: 2.6 g/dL (ref 1.5–4.5)
Glucose: 87 mg/dL (ref 65–99)
Potassium: 4.3 mmol/L (ref 3.5–5.2)
Sodium: 140 mmol/L (ref 134–144)
Total Protein: 6.9 g/dL (ref 6.0–8.5)
eGFR: 84 mL/min/{1.73_m2} (ref >59)

## 2020-05-25 LAB — CBC WITH DIFFERENTIAL/PLATELET
Basophils Absolute: 0 10*3/uL (ref 0.0–0.2)
Basos: 0 %
EOS (ABSOLUTE): 0.2 10*3/uL (ref 0.0–0.4)
Eos: 2 %
Hematocrit: 46.1 % (ref 34.0–46.6)
Hemoglobin: 15.5 g/dL (ref 11.1–15.9)
Immature Grans (Abs): 0 10*3/uL (ref 0.0–0.1)
Immature Granulocytes: 0 %
Lymphocytes Absolute: 3.5 10*3/uL — ABNORMAL HIGH (ref 0.7–3.1)
Lymphs: 36 %
MCH: 29.4 pg (ref 26.6–33.0)
MCHC: 33.6 g/dL (ref 31.5–35.7)
MCV: 87 fL (ref 79–97)
Monocytes Absolute: 0.8 10*3/uL (ref 0.1–0.9)
Monocytes: 8 %
Neutrophils Absolute: 5.2 10*3/uL (ref 1.4–7.0)
Neutrophils: 54 %
Platelets: 368 10*3/uL (ref 150–450)
RBC: 5.28 x10E6/uL (ref 3.77–5.28)
RDW: 12.3 % (ref 11.7–15.4)
WBC: 9.8 10*3/uL (ref 3.4–10.8)

## 2020-05-25 LAB — LIPID PANEL WITH LDL/HDL RATIO
Cholesterol, Total: 260 mg/dL — ABNORMAL HIGH (ref 100–199)
HDL: 63 mg/dL (ref >39)
LDL Chol Calc (NIH): 175 mg/dL — ABNORMAL HIGH (ref 0–99)
LDL/HDL Ratio: 2.8 ratio (ref 0.0–3.2)
Triglycerides: 122 mg/dL (ref 0–149)
VLDL Cholesterol Cal: 22 mg/dL (ref 5–40)

## 2020-05-25 LAB — B12 AND FOLATE PANEL
Folate: 11.3 ng/mL (ref >3.0)
Vitamin B-12: 597 pg/mL (ref 232–1245)

## 2020-05-25 LAB — TSH: TSH: 0.812 u[IU]/mL (ref 0.450–4.500)

## 2020-05-25 LAB — HEMOGLOBIN A1C
Est. average glucose Bld gHb Est-mCnc: 120 mg/dL
Hgb A1c MFr Bld: 5.8 % — ABNORMAL HIGH (ref 4.8–5.6)

## 2020-05-25 LAB — VITAMIN D 25 HYDROXY (VIT D DEFICIENCY, FRACTURES): Vit D, 25-Hydroxy: 16.7 ng/mL — ABNORMAL LOW (ref 30.0–100.0)

## 2020-05-30 ENCOUNTER — Encounter: Payer: Self-pay | Admitting: Physician Assistant

## 2020-05-30 ENCOUNTER — Other Ambulatory Visit: Payer: Self-pay | Admitting: Physician Assistant

## 2020-05-30 ENCOUNTER — Telehealth: Payer: Self-pay

## 2020-05-30 DIAGNOSIS — E559 Vitamin D deficiency, unspecified: Secondary | ICD-10-CM

## 2020-05-30 MED ORDER — VITAMIN D (ERGOCALCIFEROL) 1.25 MG (50000 UNIT) PO CAPS: 50000.0000 [IU] | ORAL_CAPSULE | ORAL | 1 refills | Status: DC

## 2020-05-30 NOTE — Telephone Encounter (Signed)
I called patient and patient verbalized understanding of information below. Pt notified me that she would call tomorrow to schedule 6 mo f/u visit.

## 2020-05-30 NOTE — Telephone Encounter (Signed)
-----   Message from Mar Daring, [place] sent at 05/30/2020  1:07 PM EDT ----- Jonelle Sidle,  Cholesterol and A1c increased compared to last year. Currently your risk of having a cardiovascular event over the next 10 years is moderately increased at 5.7%. May be worthwhile to consider a cholesterol lowering medication. Also work on those healthy lifestyle modifications. Vit D is also low. Will send in high dose Vit D prescription. All other labs are normal and stable.   Best Wishes, Grace Bushy, PAC

## 2020-05-30 NOTE — Telephone Encounter (Signed)
I called pt and pt verbalized understanding of information below. Pt wants to make lifestyle changes such as diet and exercise changes before attempting to use cholesterol medication. Pt would like to know if she should schedule a visit to see if these healthy lifestyle modifications are working and how soon? Pt would also like to know if she should stop birth control and get hormone levels checked?

## 2020-05-30 NOTE — Telephone Encounter (Signed)
I normally give 6 months of lifestyle changes  She could wait to see if Vit D helps her with some symptoms first. If not helping, she can then do the trial of stopping and checking hormones for menopause

## 2020-06-27 ENCOUNTER — Encounter: Payer: Self-pay | Admitting: Physician Assistant

## 2020-07-01 ENCOUNTER — Other Ambulatory Visit: Payer: Self-pay | Admitting: Neurology

## 2020-07-01 DIAGNOSIS — M5481 Occipital neuralgia: Secondary | ICD-10-CM

## 2020-07-08 ENCOUNTER — Other Ambulatory Visit: Payer: Self-pay

## 2020-07-08 MED FILL — Norethindrone Ace & Ethinyl Estradiol Tab 1 MG-20 MCG: ORAL | 84 days supply | Qty: 84 | Fill #0 | Status: AC

## 2020-07-09 ENCOUNTER — Other Ambulatory Visit: Payer: Self-pay

## 2020-07-09 ENCOUNTER — Ambulatory Visit
Admission: RE | Admit: 2020-07-09 | Discharge: 2020-07-09 | Disposition: A | Discharge status: Home / Self Care | Payer: No Typology Code available for payment source | Source: Ambulatory Visit | Attending: Neurology | Admitting: Neurology

## 2020-07-09 DIAGNOSIS — M5481 Occipital neuralgia: Secondary | ICD-10-CM | POA: Insufficient documentation

## 2020-08-22 ENCOUNTER — Other Ambulatory Visit: Payer: Self-pay

## 2020-08-22 MED FILL — Triamterene & Hydrochlorothiazide Tab 37.5-25 MG: ORAL | 90 days supply | Qty: 90 | Fill #0 | Status: AC

## 2020-08-22 MED FILL — Amlodipine Besylate Tab 5 MG (Base Equivalent): ORAL | 90 days supply | Qty: 90 | Fill #0 | Status: AC

## 2020-08-22 MED FILL — Ergocalciferol Cap 1.25 MG (50000 Unit): ORAL | 84 days supply | Qty: 12 | Fill #0 | Status: AC

## 2020-10-08 ENCOUNTER — Other Ambulatory Visit: Payer: Self-pay

## 2020-10-08 MED FILL — Norethindrone Ace & Ethinyl Estradiol Tab 1 MG-20 MCG: ORAL | 84 days supply | Qty: 84 | Fill #1 | Status: AC

## 2020-11-20 ENCOUNTER — Other Ambulatory Visit: Payer: Self-pay

## 2020-11-20 MED FILL — Amlodipine Besylate Tab 5 MG (Base Equivalent): ORAL | 90 days supply | Qty: 90 | Fill #1 | Status: AC

## 2020-11-20 MED FILL — Triamterene & Hydrochlorothiazide Tab 37.5-25 MG: ORAL | 90 days supply | Qty: 90 | Fill #1 | Status: AC

## 2021-01-01 ENCOUNTER — Other Ambulatory Visit: Payer: Self-pay

## 2021-01-01 MED FILL — Norethindrone Ace & Ethinyl Estradiol Tab 1 MG-20 MCG: ORAL | 84 days supply | Qty: 84 | Fill #2 | Status: AC

## 2021-01-02 ENCOUNTER — Other Ambulatory Visit: Payer: Self-pay

## 2021-01-02 MED ORDER — INFLUENZA VAC SPLIT QUAD 0.5 ML IM SUSY
PREFILLED_SYRINGE | INTRAMUSCULAR | 0 refills | Status: DC
  Filled 2021-01-02: qty 0.5, 1d supply, fill #0

## 2021-02-07 ENCOUNTER — Encounter: Payer: Self-pay | Admitting: Family Medicine

## 2021-02-07 ENCOUNTER — Other Ambulatory Visit: Payer: Self-pay

## 2021-02-07 ENCOUNTER — Ambulatory Visit (INDEPENDENT_AMBULATORY_CARE_PROVIDER_SITE_OTHER): Payer: No Typology Code available for payment source | Admitting: Family Medicine

## 2021-02-07 VITALS — BP 116/76 | HR 76 | Temp 97.6°F | Resp 16 | Wt 184.6 lb

## 2021-02-07 DIAGNOSIS — N951 Menopausal and female climacteric states: Secondary | ICD-10-CM

## 2021-02-07 DIAGNOSIS — F519 Sleep disorder not due to a substance or known physiological condition, unspecified: Secondary | ICD-10-CM

## 2021-02-07 DIAGNOSIS — R4586 Emotional lability: Secondary | ICD-10-CM | POA: Diagnosis not present

## 2021-02-07 DIAGNOSIS — G43829 Menstrual migraine, not intractable, without status migrainosus: Secondary | ICD-10-CM | POA: Diagnosis not present

## 2021-02-07 MED ORDER — TRAZODONE HCL 50 MG PO TABS
25.0000 mg | ORAL_TABLET | Freq: Every evening | ORAL | 3 refills | Status: DC | PRN
  Filled 2021-02-07: qty 30, 30d supply, fill #0
  Filled 2021-05-23: qty 30, 30d supply, fill #1

## 2021-02-07 NOTE — Assessment & Plan Note (Signed)
Symptoms consistent with menopause Check FSH/LH FSH/LH may be affected by Loestrin

## 2021-02-07 NOTE — Assessment & Plan Note (Addendum)
Chronic insomnia Start Trazodone Discussed sleep hygeine

## 2021-02-07 NOTE — Progress Notes (Signed)
Established patient visit   Patient: Ashley [name]   DOB: 04/19/1970   50 y.o. Female  MRN: [id number] Visit Date: 02/07/2021  Today's healthcare provider: Lavon Paganini, MD   Chief Complaint  Patient presents with   Fatigue    X a few weeks, concerned about menopause. Patient reports symptoms getting worse in the last few days.   mood swings    Lack of concentration, "crying out of no where"   Subjective     HPI     Fatigue    Additional comments: X a few weeks, concerned about menopause. Patient reports symptoms getting worse in the last few days.        mood swings    Additional comments: Lack of concentration, "crying out of no where"      Last edited by Dorian Pod, CMA on 02/07/2021 10:52 AM.      Insomnia - Chronic problem since she was in her 74s - No trouble falling asleep, but has difficulty staying asleep - Has fatigue associated with lack of sleep - Is not currently taking any medication for sleep - Has negative sleep studies in the past   Menopausal symptoms - Emotional lability, irritability, and "crying for no reason" started 1 month ago - Weight gain - Lack of focus - Amenorrhea due to Loestrin, which she is taking for migraines  Medications: Outpatient Medications Prior to Visit  Medication Sig   albuterol (VENTOLIN HFA) 108 (90 Base) MCG/ACT inhaler USE PRIOR TO EXERCISE AS DIRECTED   amLODipine (NORVASC) 5 MG tablet TAKE 1 TABLET BY MOUTH DAILY.   azelastine (ASTELIN) 0.1 % nasal spray PLACE 1 SPRAY INTO BOTH NOSTRILS 2 TIMES DAILY. USE IN EACH NOSTRIL AS DIRECTED   baclofen (LIORESAL) 10 MG tablet TAKE 1 TABLET BY MOUTH TWO TIMES A DAY AS NEEDED   influenza vac split quadrivalent PF (FLUARIX) 0.5 ML injection Inject into the muscle.   norethindrone-ethinyl estradiol (LOESTRIN) 1-20 MG-MCG tablet TAKE 1 TABLET BY MOUTH DAILY.   triamterene-hydrochlorothiazide (MAXZIDE-25) 37.5-25 MG tablet TAKE 1 TABLET BY MOUTH DAILY.    Vitamin D, Ergocalciferol, (DRISDOL) 1.25 MG (50000 UNIT) CAPS capsule TAKE 1 CAPSULE BY MOUTH EVERY 7 DAYS.   No facility-administered medications prior to visit.    Review of Systems  Constitutional:  Positive for fatigue.  Respiratory:  Negative for shortness of breath.   Cardiovascular:  Negative for chest pain.  Endocrine: Negative for heat intolerance.  Musculoskeletal:  Positive for neck pain.  Neurological:  Positive for numbness.  Psychiatric/Behavioral:  Positive for decreased concentration and sleep disturbance.   All other systems reviewed and are negative.     Objective    BP 116/76 (BP Location: Left Arm, Patient Position: Sitting, Cuff Size: Large)   Pulse 76   Temp 97.6 F (36.4 C) (Temporal)   Resp 16   Wt 184 lb 9.6 oz (83.7 kg)   SpO2 98%   BMI 30.72 kg/m  {Show previous vital signs (optional):23777}  Physical Exam Vitals reviewed.  Constitutional:      General: She is not in acute distress.    Appearance: Normal appearance. She is well-developed. She is not diaphoretic.  HENT:     Head: Normocephalic and atraumatic.  Eyes:     General: No scleral icterus.    Conjunctiva/sclera: Conjunctivae normal.  Neck:     Thyroid: No thyromegaly.  Cardiovascular:     Rate and Rhythm: Normal rate and regular rhythm.  Pulses: Normal pulses.     Heart sounds: Normal heart sounds. No murmur heard. Pulmonary:     Effort: Pulmonary effort is normal. No respiratory distress.     Breath sounds: Normal breath sounds. No wheezing, rhonchi or rales.  Musculoskeletal:     Cervical back: Neck supple.     Right lower leg: No edema.     Left lower leg: No edema.  Lymphadenopathy:     Cervical: No cervical adenopathy.  Skin:    General: Skin is warm and dry.     Findings: No rash.  Neurological:     Mental Status: She is alert and oriented to person, place, and time. Mental status is at baseline.  Psychiatric:        Mood and Affect: Mood normal.         Behavior: Behavior normal.      No results found for any visits on 02/07/21.  Assessment & Plan     Problem List Items Addressed This Visit       Cardiovascular and Mediastinum   Menstrual migraine without status migrainosus, not intractable    Currently wel lcontrolled Suspect patient may be menopausal and may be able to d/c her OCPs pending labs      Relevant Medications   traZODone (DESYREL) 50 MG tablet     Nervous and Auditory   Non-organic sleep disorder    Chronic insomnia Start Trazodone Discussed sleep hygeine      Relevant Medications   traZODone (DESYREL) 50 MG tablet     Other   Mood swings - Primary    Symptoms consistent with menopause Check FSH/LH FSH/LH may be affected by Loestrin      Relevant Orders   FSH/LH   TSH   Other Visit Diagnoses     Menopausal symptoms       Relevant Orders   FSH/LH   TSH        Return in about 3 months (around 05/08/2021) for CPE, With new PCP after 3/18.      Nelva Nay, Medical Student 02/10/2021, 10:59 AM    Patient seen along with MS3 student Nelva Nay. I personally evaluated this patient along with the student, and verified all aspects of the history, physical exam, and medical decision making as documented by the student. I agree with the student's documentation and have made all necessary edits.  Promyse Ardito, Dionne Bucy, MD, MPH [facility]

## 2021-02-10 DIAGNOSIS — G43829 Menstrual migraine, not intractable, without status migrainosus: Secondary | ICD-10-CM | POA: Insufficient documentation

## 2021-02-10 NOTE — Assessment & Plan Note (Signed)
Currently wel lcontrolled Suspect patient may be menopausal and may be able to d/c her OCPs pending labs

## 2021-02-11 LAB — FSH/LH
FSH: 1.1 m[IU]/mL
LH: 0.4 m[IU]/mL

## 2021-02-11 LAB — TSH: TSH: 2.12 u[IU]/mL (ref 0.450–4.500)

## 2021-02-21 MED FILL — Triamterene & Hydrochlorothiazide Tab 37.5-25 MG: ORAL | 90 days supply | Qty: 90 | Fill #2 | Status: AC

## 2021-02-21 MED FILL — Amlodipine Besylate Tab 5 MG (Base Equivalent): ORAL | 90 days supply | Qty: 90 | Fill #2 | Status: AC

## 2021-02-23 ENCOUNTER — Other Ambulatory Visit: Payer: Self-pay

## 2021-02-24 ENCOUNTER — Other Ambulatory Visit: Payer: Self-pay

## 2021-03-10 IMAGING — CT CT HEAD W/O CM
1 series · 15 of 30 positions shown, 19 images · non-contrast
Comparison: CT head 12/16/2016

CLINICAL DATA: Syncopal episode/LOC yesterday morning after feeling
dizzy. Pt hit forehead and has small laceration to area. C/o
headaches with pressure in forehead and pain to right side neck.

EXAM:
CT HEAD WITHOUT CONTRAST
CT CERVICAL SPINE WITHOUT CONTRAST
TECHNIQUE: Multidetector CT imaging of the head and cervical spine was
performed following the standard protocol without intravenous
contrast. Multiplanar CT image reconstructions of the cervical spine
were also generated.

[Series 2: head wo · axial · 0.41mm/px · z∈[-103,+32]mm · 15 of 31 slices shown, 19 images]
[im 2/31  brain]
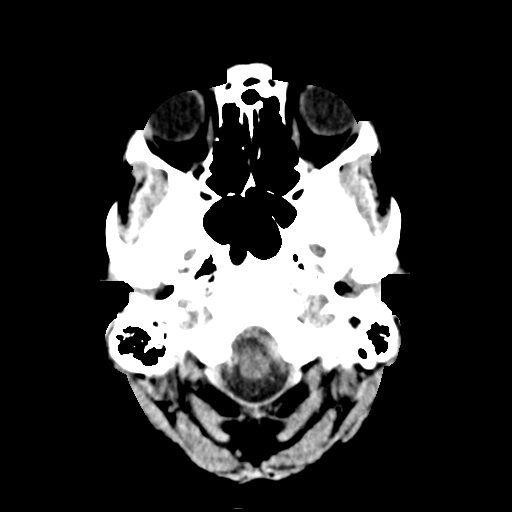
[im 2/31  bone]
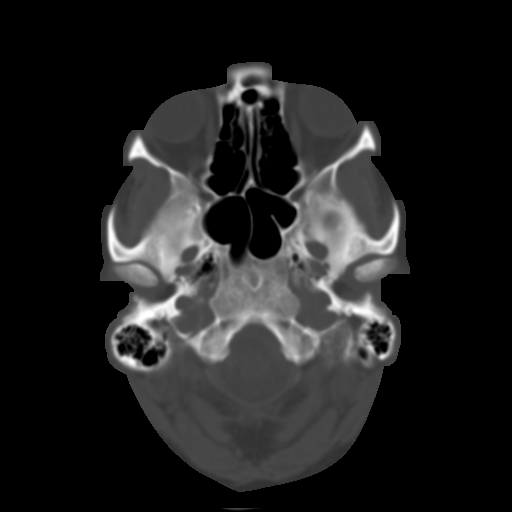
[im 4/31  brain]
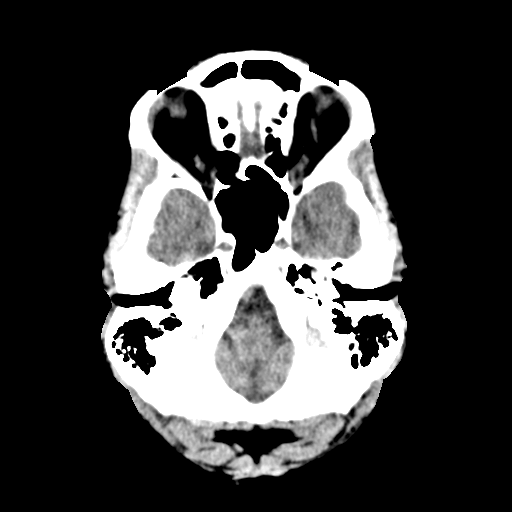
[im 6/31  brain]
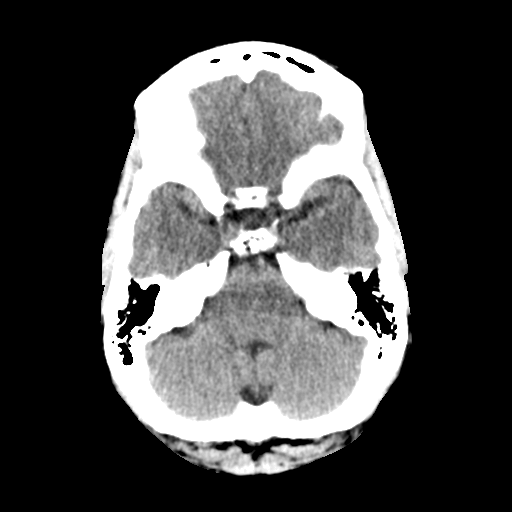
[im 8/31  brain]
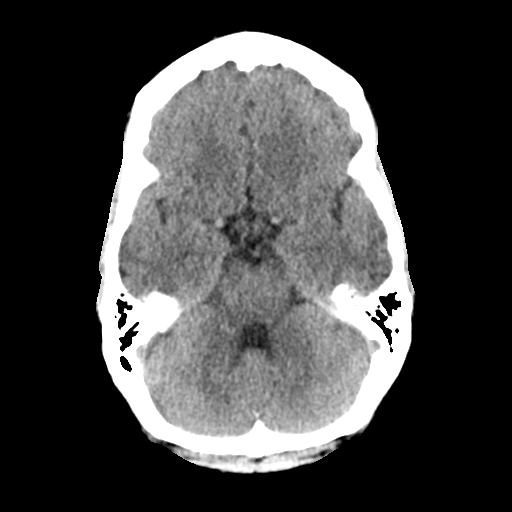
[im 10/31  brain]
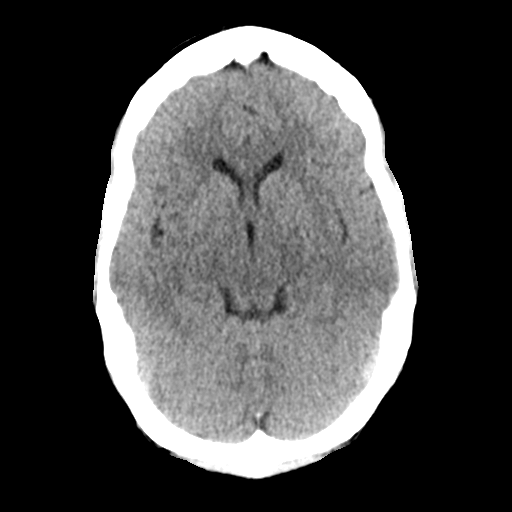
[im 10/31  bone]
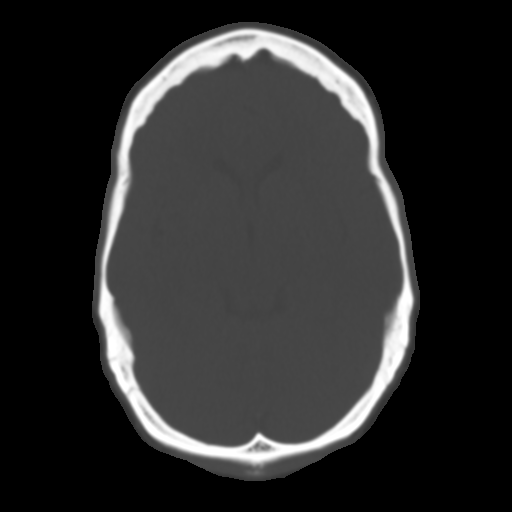
[im 12/31  brain]
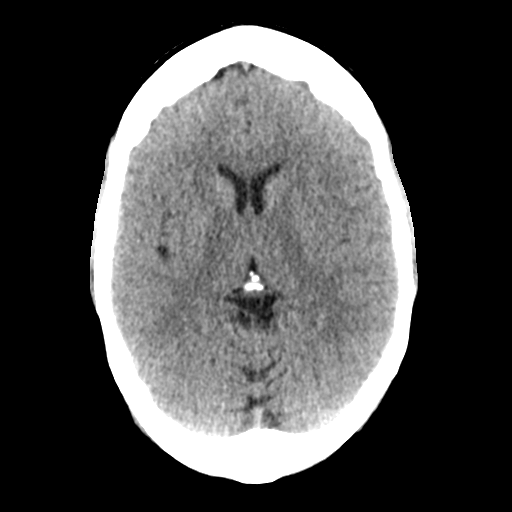
[im 14/31  brain]
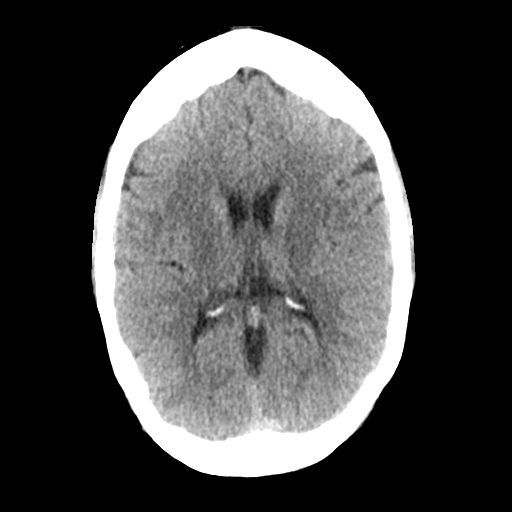
[im 16/31  brain]
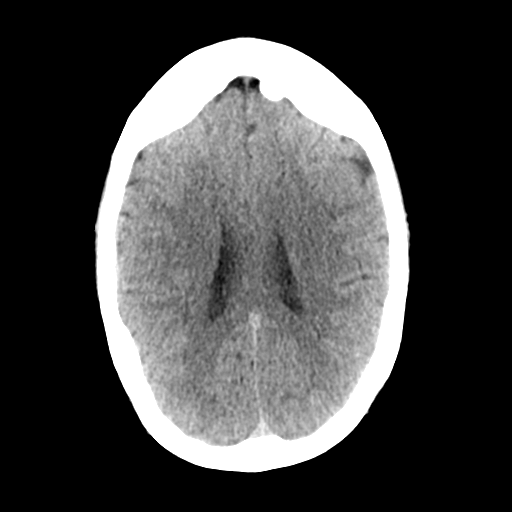
[im 17/31  brain]
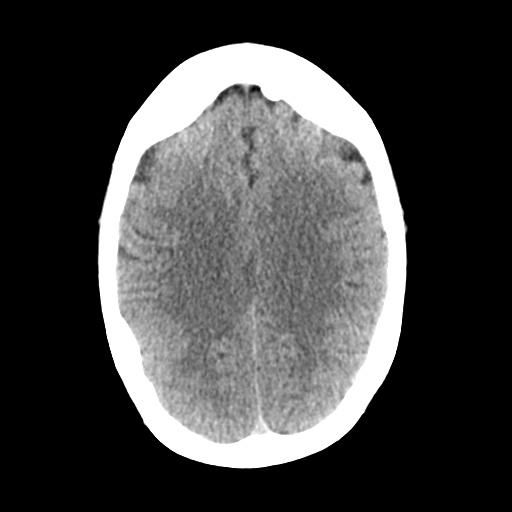
[im 17/31  bone]
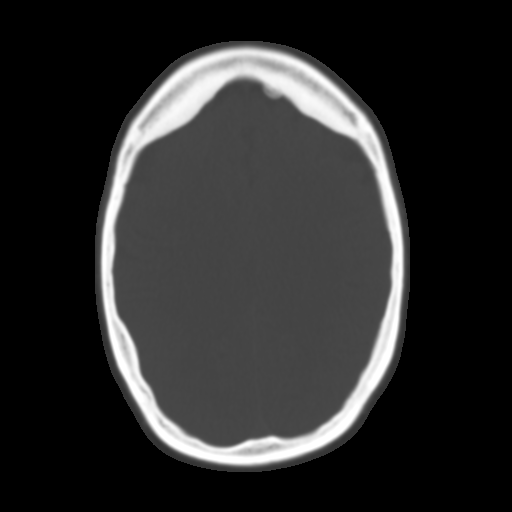
[im 19/31  brain]
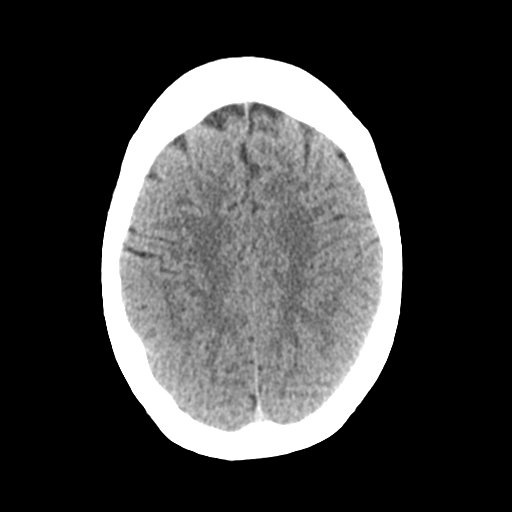
[im 21/31  brain]
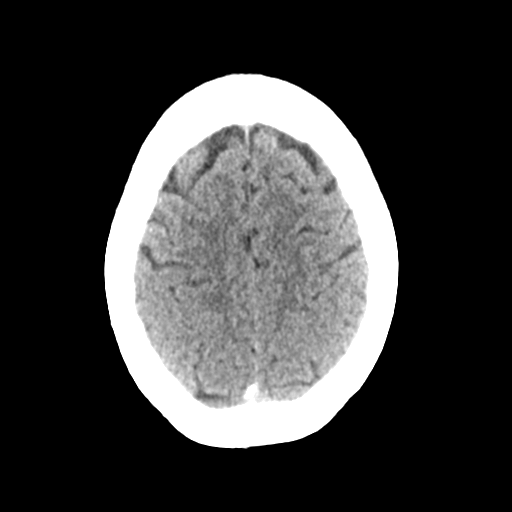
[im 23/31  brain]
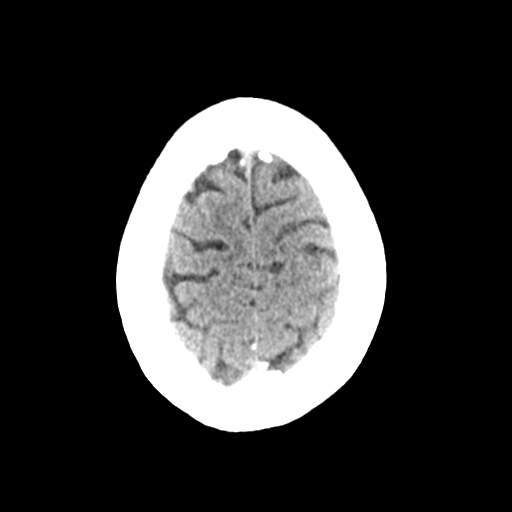
[im 25/31  brain]
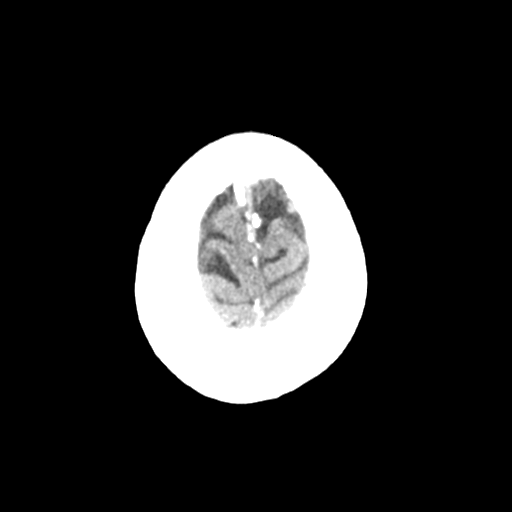
[im 25/31  bone]
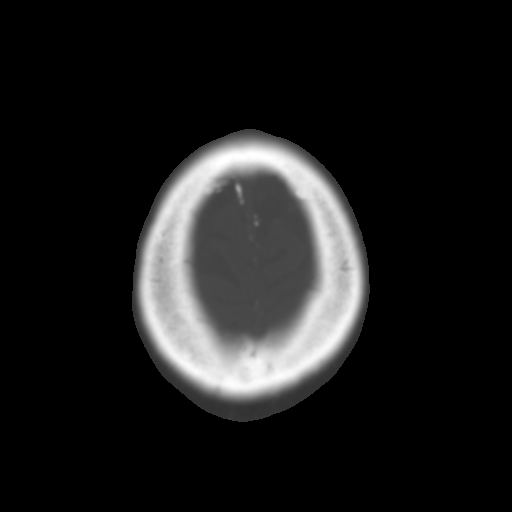
[im 27/31  brain]
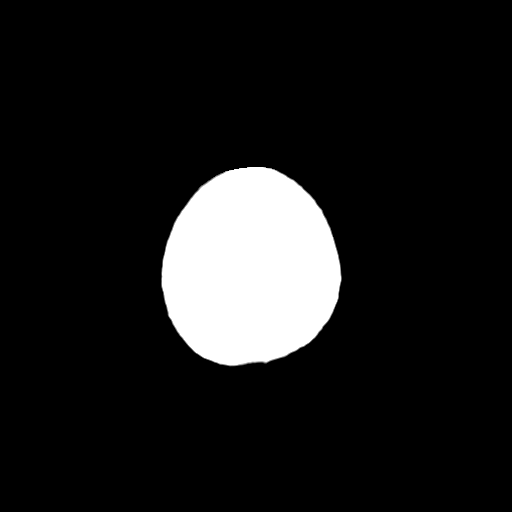
[im 29/31  brain]
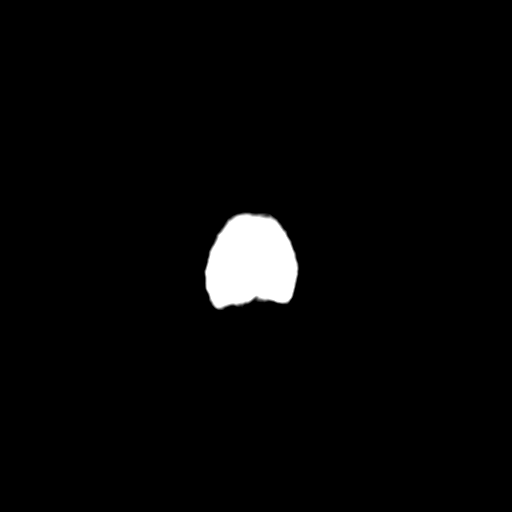

[15 of 30 positions shown; findings below may reference images not displayed]

FINDINGS: CT HEAD FINDINGS

Brain: No evidence of acute infarction, hemorrhage, hydrocephalus,
extra-axial collection. There is again a small left frontal dural
base calcification (series 3, image 40) which has slightly increased
in size now measuring 0.9 x 0.5 cm, previously measuring 0.7 x
cm, likely a small meningioma.

Vascular: No hyperdense vessel or unexpected calcification.

Skull: Normal. Negative for fracture or focal lesion.

Sinuses/Orbits: No acute finding.

Other: None.

CT CERVICAL SPINE FINDINGS

Alignment: Normal.

Skull base and vertebrae: No acute fracture. No primary bone lesion
or focal pathologic process.

Soft tissues and spinal canal: No prevertebral fluid or swelling. No
visible canal hematoma.

Disc levels: Mild degenerative disc changes in the mid cervical
spine.

Upper chest: Negative.

Other: None.
IMPRESSION: 1. No acute intracranial abnormality.
2. No acute fracture or subluxation of the cervical spine.

## 2021-03-27 ENCOUNTER — Other Ambulatory Visit: Payer: Self-pay

## 2021-03-27 MED FILL — Norethindrone Ace & Ethinyl Estradiol Tab 1 MG-20 MCG: ORAL | 84 days supply | Qty: 84 | Fill #3 | Status: AC

## 2021-04-23 ENCOUNTER — Other Ambulatory Visit: Payer: Self-pay

## 2021-04-23 MED ORDER — OMRON 3 SERIES BP MONITOR DEVI
0 refills | Status: AC
Start: 2021-04-23 — End: ?
  Filled 2021-04-23: qty 1, 30d supply, fill #0

## 2021-05-16 ENCOUNTER — Encounter: Payer: No Typology Code available for payment source | Admitting: Obstetrics and Gynecology

## 2021-05-21 ENCOUNTER — Ambulatory Visit (INDEPENDENT_AMBULATORY_CARE_PROVIDER_SITE_OTHER): Payer: No Typology Code available for payment source | Admitting: Obstetrics and Gynecology

## 2021-05-21 ENCOUNTER — Other Ambulatory Visit: Payer: Self-pay

## 2021-05-21 ENCOUNTER — Encounter: Payer: Self-pay | Admitting: Obstetrics and Gynecology

## 2021-05-21 VITALS — BP 159/90 | HR 91 | Resp 16 | Ht 65.0 in | Wt 186.6 lb

## 2021-05-21 DIAGNOSIS — Z308 Encounter for other contraceptive management: Secondary | ICD-10-CM

## 2021-05-21 DIAGNOSIS — Z1231 Encounter for screening mammogram for malignant neoplasm of breast: Secondary | ICD-10-CM

## 2021-05-21 DIAGNOSIS — E785 Hyperlipidemia, unspecified: Secondary | ICD-10-CM

## 2021-05-21 DIAGNOSIS — N951 Menopausal and female climacteric states: Secondary | ICD-10-CM | POA: Diagnosis not present

## 2021-05-21 DIAGNOSIS — I10 Essential (primary) hypertension: Secondary | ICD-10-CM

## 2021-05-21 DIAGNOSIS — Z1211 Encounter for screening for malignant neoplasm of colon: Secondary | ICD-10-CM

## 2021-05-21 DIAGNOSIS — Z01419 Encounter for gynecological examination (general) (routine) without abnormal findings: Secondary | ICD-10-CM | POA: Diagnosis not present

## 2021-05-21 DIAGNOSIS — F519 Sleep disorder not due to a substance or known physiological condition, unspecified: Secondary | ICD-10-CM

## 2021-05-21 DIAGNOSIS — R7303 Prediabetes: Secondary | ICD-10-CM

## 2021-05-21 NOTE — Progress Notes (Signed)
? ?ANNUAL PREVENTATIVE CARE GYNECOLOGY  ENCOUNTER NOTE ? ?Subjective:  ?    ? Ashley [name] is a 51 y.o. female here to establish care, and for a routine annual gynecologic exam.  Previously received care at [facility], but felt she also needed a GYN. The patient is sexually active. The patient wears seatbelts: yes. The patient participates in regular exercise: yes. Has the patient ever been transfused or tattooed?: no. The patient reports that there is not domestic violence in her life. ? ?Current complaints: ?1.  She has symptoms of menopause. Can't lose wait, unable to sleep, decreased energy, mood swings, fatigue. Reports occasional hot flushes. Had labs checked in December, notes that she was told she was not yet in menopause.  ?2. Of note, patient's difficulty with sleeping has been a chronic problem, since her early 16s. Has had sleep studies with no findings. PCP recently tried her on Trazadone, but patient notes that it did not help. Denies rumination or intrusive thoughts. Sometimes can fall asleep but wakes in the middle of the night.  Engages in sleep hygiene.  ?  ?Gynecologic History ?Patient's last menstrual period was 02/07/2021.  Patient is perimenopausal.  ?Contraception: OCP (estrogen/progesterone) ?Last Pap: 12/28/2018. Results were: normal ?Last mammogram: 05/24/2020. Results were: Stable right breast calcifications, which given stability for over 2 ?years are considered benign. No mammographic evidence of malignancy ?bilaterally. ?Last Colonoscopy: never had one.  Cologuard negative (05/15/2018) ?Last Dexa Scan: Never had one.  ? ? ?Obstetric History ?OB History  ?No obstetric history on file.  ? ? ?Past Medical History:  ?Diagnosis Date  ? Brain hemangioma ([place])   ? Benig in 2018, never went to get head MRI  ? Hypertension   ? Migraine   ? ? ?Family History  ?Problem Relation Age of Onset  ? Hypertension Mother   ? Hyperlipidemia Mother   ? Hypertension Father   ?  Hyperlipidemia Father   ? Diabetes Mellitus I Sister   ? Diabetes Mellitus I Brother   ? Diabetes Mellitus II Paternal Grandfather   ? Healthy Sister   ? Healthy Sister   ? Healthy Brother   ? Healthy Brother   ? Breast cancer Neg Hx   ? Colon cancer Neg Hx   ? Ovarian cancer Neg Hx   ? ? ?Past Surgical History:  ?Procedure Laterality Date  ? BREAST BIOPSY Right 06/03/2018  ? X clip stereo bx CALCIUM OXALATE CRYSTALS WITHIN APOCRINE MICROCYSTS USUAL DUCTAL HYPERPLASIA, COLUMNAR CELL CHANGE, AND FOCAL PSEUDOANGIOMATOUS STROMAL HYPERPLASIA ([place]  ? BREAST CYST ASPIRATION Right 10+ yrs ago  ? CHOLECYSTECTOMY    ? FOOT SURGERY    ? ? ?Social History  ? ?Socioeconomic History  ? Marital status: Single  ?  Spouse name: Not on file  ? Number of children: Not on file  ? Years of education: Not on file  ? Highest education level: Not on file  ?Occupational History  ? Not on file  ?Tobacco Use  ? Smoking status: Never  ? Smokeless tobacco: Never  ?Vaping Use  ? Vaping Use: Never used  ?Substance and Sexual Activity  ? Alcohol use: Yes  ?  Alcohol/week: 0.0 standard drinks  ?  Comment: rarely  ? Drug use: Never  ? Sexual activity: Not on file  ?Other Topics Concern  ? Not on file  ?Social History Narrative  ? Not on file  ? ?Social Determinants of Health  ? ?Financial Resource Strain: Not on file  ?Food Insecurity:  Not on file  ?Transportation Needs: Not on file  ?Physical Activity: Not on file  ?Stress: Not on file  ?Social Connections: Not on file  ?Intimate Partner Violence: Not on file  ? ? ?Current Outpatient Medications on File Prior to Visit  ?Medication Sig Dispense Refill  ? albuterol (VENTOLIN HFA) 108 (90 Base) MCG/ACT inhaler USE PRIOR TO EXERCISE AS DIRECTED 18 g 0  ? amLODipine (NORVASC) 5 MG tablet TAKE 1 TABLET BY MOUTH DAILY. 90 tablet 3  ? azelastine (ASTELIN) 0.1 % nasal spray PLACE 1 SPRAY INTO BOTH NOSTRILS 2 TIMES DAILY. USE IN EACH NOSTRIL AS DIRECTED 30 mL 12  ? Blood Pressure Monitoring (OMRON 3 SERIES  BP MONITOR) DEVI Use as directed 1 each 0  ? influenza vac split quadrivalent PF (FLUARIX) 0.5 ML injection Inject into the muscle. 0.5 mL 0  ? norethindrone-ethinyl estradiol (LOESTRIN) 1-20 MG-MCG tablet TAKE 1 TABLET BY MOUTH DAILY. 84 tablet 4  ? traZODone (DESYREL) 50 MG tablet Take 0.5-1 tablets (25-50 mg total) by mouth at bedtime as needed for sleep. 30 tablet 3  ? triamterene-hydrochlorothiazide (MAXZIDE-25) 37.5-25 MG tablet TAKE 1 TABLET BY MOUTH DAILY. 90 tablet 3  ? Vitamin D, Ergocalciferol, (DRISDOL) 1.25 MG (50000 UNIT) CAPS capsule TAKE 1 CAPSULE BY MOUTH EVERY 7 DAYS. 12 capsule 1  ? [DISCONTINUED] phentermine (ADIPEX-P) 37.5 MG tablet Take 1 tablet (37.5 mg total) by mouth daily before breakfast. (Patient not taking: Reported on 09/15/2019) 30 tablet 2  ? ?No current facility-administered medications on file prior to visit.  ? ? ?Allergies  ?Allergen Reactions  ? Ciprofloxacin Hives  ? Codeine Nausea And Vomiting  ? Gabapentin Nausea And Vomiting  ? Hydrocodone-Acetaminophen Nausea And Vomiting  ? Hydromorphone Hcl Nausea And Vomiting  ? Oxycodone-Acetaminophen Nausea And Vomiting  ? Promethazine Hcl Nausea And Vomiting  ? Tramadol Nausea And Vomiting  ? Sulfa Antibiotics Rash  ?  hives  ? ? ? ? ?Review of Systems ?ROS ?Review of Systems - General ROS: negative for - chills, fatigue, fever, hot flashes, night sweats, weight gain or weight loss ?Psychological ROS: negative for - anxiety, decreased libido, depression, mood swings, physical abuse or sexual abuse ?Ophthalmic ROS: negative for - blurry vision, eye pain or loss of vision ?ENT ROS: negative for - headaches, hearing change, visual changes or vocal changes ?Allergy and Immunology ROS: negative for - hives, itchy/watery eyes or seasonal allergies ?Hematological and Lymphatic ROS: negative for - bleeding problems, bruising, swollen lymph nodes or weight loss ?Endocrine ROS: negative for - galactorrhea, hair pattern changes, hot flashes,  malaise/lethargy, mood swings, palpitations, polydipsia/polyuria, skin changes, temperature intolerance or unexpected weight changes ?Breast ROS: negative for - new or changing breast lumps or nipple discharge ?Respiratory ROS: negative for - cough or shortness of breath ?Cardiovascular ROS: negative for - chest pain, irregular heartbeat, palpitations or shortness of breath ?Gastrointestinal ROS: no abdominal pain, change in bowel habits, or black or bloody stools ?Genito-Urinary ROS: no dysuria, trouble voiding, or hematuria ?Musculoskeletal ROS: negative for - joint pain or joint stiffness ?Neurological ROS: negative for - bowel and bladder control changes ?Dermatological ROS: negative for rash and skin lesion changes ?  ?Objective:  ? ?BP (!) 159/90   Pulse 91   Resp 16   Ht 5' 5"  (1.651 m)   Wt 186 lb 9.6 oz (84.6 kg)   LMP 02/07/2021   BMI 31.05 kg/m?  ?CONSTITUTIONAL: Well-developed, well-nourished female in no acute distress.  ?PSYCHIATRIC: Normal mood and affect. Normal  behavior. Normal judgment and thought content. ?[place]: Alert and oriented to person, place, and time. Normal muscle tone coordination. No cranial nerve deficit noted. ?HENT:  Normocephalic, atraumatic, External right and left ear normal. Oropharynx is clear and moist ?EYES: Conjunctivae and EOM are normal. Pupils are equal, round, and reactive to light. No scleral icterus.  ?NECK: Normal range of motion, supple, no masses.  Normal thyroid.  ?SKIN: Skin is warm and dry. No rash noted. Not diaphoretic. No erythema. No pallor. ?CARDIOVASCULAR: Normal heart rate noted, regular rhythm, no murmur. ?RESPIRATORY: Clear to auscultation bilaterally. Effort and breath sounds normal, no problems with respiration noted. ?BREASTS: Symmetric in size. No masses, skin changes, nipple drainage, or lymphadenopathy. Biopsy scare on right breast with clip placement felt at 9  o'clock, ~ 1-2 cm from areola.  ?ABDOMEN: Soft, normal bowel sounds, no  distention noted.  No tenderness, rebound or guarding.  ?BLADDER: Normal ?PELVIC: ? Bladder no bladder distension noted ? Urethra: normal appearing urethra with no masses, tenderness or lesions ? Vulva: normal appearing vulva wit

## 2021-05-22 ENCOUNTER — Other Ambulatory Visit: Payer: Self-pay

## 2021-05-22 ENCOUNTER — Other Ambulatory Visit: Payer: Self-pay | Admitting: Obstetrics and Gynecology

## 2021-05-22 MED ORDER — CLENPIQ 10-3.5-12 MG-GM -GM/160ML PO SOLN
1.0000 | Freq: Once | ORAL | 0 refills | Status: AC
Start: 2021-05-22 — End: 2021-05-31
  Filled 2021-05-22: qty 320, 1d supply, fill #0

## 2021-05-22 MED ORDER — SLYND 4 MG PO TABS
1.0000 | ORAL_TABLET | Freq: Every day | ORAL | 3 refills | Status: DC
  Filled 2021-05-22 – 2021-05-30 (×2): qty 84, 84d supply, fill #0
  Filled 2021-06-04: qty 28, 28d supply, fill #0
  Filled 2021-06-18: qty 84, 84d supply, fill #0

## 2021-05-22 NOTE — Progress Notes (Signed)
Gastroenterology Pre-Procedure Review ? ?Request Date: 07/04/2021 ?Requesting Physician: Dr. Marius Ditch ? ?PATIENT REVIEW QUESTIONS: The patient responded to the following health history questions as indicated:   ? ?1. Are you having any GI issues? no ?2. Do you have a personal history of Polyps? no ?3. Do you have a family history of Colon Cancer or Polyps? yes (Maternal Grandmother & Aunt-colon cancer.) ?4. Diabetes Mellitus? no ?5. Joint replacements in the past 12 months?no ?6. Major health problems in the past 3 months?no ?7. Any artificial heart valves, MVP, or defibrillator?no ?   ?MEDICATIONS & ALLERGIES:    ?Patient reports the following regarding taking any anticoagulation/antiplatelet therapy:   ?Plavix, Coumadin, Eliquis, Xarelto, Lovenox, Pradaxa, Brilinta, or Effient? no ?Aspirin? no ? ?Patient confirms/reports the following medications:  ?Current Outpatient Medications  ?Medication Sig Dispense Refill  ? albuterol (VENTOLIN HFA) 108 (90 Base) MCG/ACT inhaler USE PRIOR TO EXERCISE AS DIRECTED 18 g 0  ? amLODipine (NORVASC) 5 MG tablet TAKE 1 TABLET BY MOUTH DAILY. 90 tablet 3  ? azelastine (ASTELIN) 0.1 % nasal spray PLACE 1 SPRAY INTO BOTH NOSTRILS 2 TIMES DAILY. USE IN EACH NOSTRIL AS DIRECTED 30 mL 12  ? Blood Pressure Monitoring (OMRON 3 SERIES BP MONITOR) DEVI Use as directed 1 each 0  ? influenza vac split quadrivalent PF (FLUARIX) 0.5 ML injection Inject into the muscle. 0.5 mL 0  ? norethindrone-ethinyl estradiol (LOESTRIN) 1-20 MG-MCG tablet TAKE 1 TABLET BY MOUTH DAILY. 84 tablet 4  ? traZODone (DESYREL) 50 MG tablet Take 0.5-1 tablets (25-50 mg total) by mouth at bedtime as needed for sleep. 30 tablet 3  ? triamterene-hydrochlorothiazide (MAXZIDE-25) 37.5-25 MG tablet TAKE 1 TABLET BY MOUTH DAILY. 90 tablet 3  ? Vitamin D, Ergocalciferol, (DRISDOL) 1.25 MG (50000 UNIT) CAPS capsule TAKE 1 CAPSULE BY MOUTH EVERY 7 DAYS. 12 capsule 1  ? ?No current facility-administered medications for this visit.   ? ? ?Patient confirms/reports the following allergies:  ?Allergies  ?Allergen Reactions  ? Ciprofloxacin Hives  ? Codeine Nausea And Vomiting  ? Gabapentin Nausea And Vomiting  ? Hydrocodone-Acetaminophen Nausea And Vomiting  ? Hydromorphone Hcl Nausea And Vomiting  ? Oxycodone-Acetaminophen Nausea And Vomiting  ? Promethazine Hcl Nausea And Vomiting  ? Tramadol Nausea And Vomiting  ? Sulfa Antibiotics Rash  ?  hives  ? ? ?No orders of the defined types were placed in this encounter. ? ? ?AUTHORIZATION INFORMATION ?Primary Insurance: ?1D#: ?Group #: ? ?Secondary Insurance: ?1D#: ?Group #: ? ?SCHEDULE INFORMATION: ?Date: 07/04/2021 ?Time: ?Location: [facility] ? ?

## 2021-05-23 ENCOUNTER — Other Ambulatory Visit: Payer: Self-pay

## 2021-05-23 ENCOUNTER — Other Ambulatory Visit: Payer: Self-pay | Admitting: Physician Assistant

## 2021-05-23 ENCOUNTER — Telehealth: Payer: Self-pay

## 2021-05-23 DIAGNOSIS — I10 Essential (primary) hypertension: Secondary | ICD-10-CM

## 2021-05-23 NOTE — Telephone Encounter (Signed)
Patient wanted to inform our office that she has a sensitivity to anesthesia. Informed patient to let them know when she calls to get her procedure and also the morning of procedure. Pt verbalized understanding. ?

## 2021-05-26 ENCOUNTER — Other Ambulatory Visit: Payer: Self-pay

## 2021-05-27 ENCOUNTER — Other Ambulatory Visit: Payer: Self-pay

## 2021-05-27 MED ORDER — TRIAMTERENE-HCTZ 37.5-25 MG PO TABS
1.0000 | ORAL_TABLET | Freq: Every day | ORAL | 3 refills | Status: DC
  Filled 2021-05-27: qty 90, 90d supply, fill #0
  Filled 2021-08-21: qty 90, 90d supply, fill #1
  Filled 2021-11-28: qty 90, 90d supply, fill #2
  Filled 2022-03-03: qty 90, 90d supply, fill #3

## 2021-05-27 MED ORDER — AMLODIPINE BESYLATE 5 MG PO TABS
ORAL_TABLET | Freq: Every day | ORAL | 3 refills | Status: DC
  Filled 2021-05-27: qty 90, 90d supply, fill #0
  Filled 2021-08-21: qty 90, 90d supply, fill #1
  Filled 2021-11-28: qty 90, 90d supply, fill #2
  Filled 2022-03-03: qty 90, 90d supply, fill #3

## 2021-05-28 ENCOUNTER — Other Ambulatory Visit: Payer: Self-pay

## 2021-05-29 NOTE — Progress Notes (Signed)
? ?I,Sabine Tenenbaum,acting as a scribe for [employer], PA-C.,have documented all relevant documentation on the behalf of Mikey Kirschner, PA-C,as directed by  Mikey Kirschner, PA-C while in the presence of Mikey Kirschner, PA-C. ? ? ?Complete physical exam ? ? ?Patient: Ashley [name]   DOB: [birth year]   51 y.o. Female  MRN: [id number] ?Visit Date: 05/30/2021 ? ?Today's healthcare provider: Mikey Kirschner, PA-C  ? ?Cc. cpe ? ?Subjective  ?  ?Ashley [name] is a 51 y.o. female who presents today for a complete physical exam.  ?She reports consuming a general and more plant based  diet.  The patient reports doing some cardio and strength exercises 7 days a week for 30 -45 minutes.   She generally feels well. She reports sleeping poorly.  ? ?Reports that in the fall/late summer she noticed a change in the symmetry of her face, feels the left side of her mouth doesn't go up as far, that her left eye doesn't open as much.  ?She reports it is better now but she still notices it.  ?Denies changes in sensation of the face, no difficulty open and closing eyes, eating, speaking, swallowing.  ? ? ?Past Medical History:  ?Diagnosis Date  ? Brain hemangioma ([place])   ? Benig in 2018, never went to get head MRI  ? Hypertension   ? Migraine   ? ?Past Surgical History:  ?Procedure Laterality Date  ? BREAST BIOPSY Right 06/03/2018  ? X clip stereo bx CALCIUM OXALATE CRYSTALS WITHIN APOCRINE MICROCYSTS USUAL DUCTAL HYPERPLASIA, COLUMNAR CELL CHANGE, AND FOCAL PSEUDOANGIOMATOUS STROMAL HYPERPLASIA ([place]  ? BREAST CYST ASPIRATION Right 10+ yrs ago  ? CHOLECYSTECTOMY    ? FOOT SURGERY    ? ?Social History  ? ?Socioeconomic History  ? Marital status: Single  ?  Spouse name: Not on file  ? Number of children: Not on file  ? Years of education: Not on file  ? Highest education level: Not on file  ?Occupational History  ? Not on file  ?Tobacco Use  ? Smoking status: Never  ? Smokeless tobacco: Never  ?Vaping Use  ? Vaping Use: Never used   ?Substance and Sexual Activity  ? Alcohol use: Yes  ?  Alcohol/week: 0.0 standard drinks  ?  Comment: rarely  ? Drug use: Never  ? Sexual activity: Not on file  ?Other Topics Concern  ? Not on file  ?Social History Narrative  ? Not on file  ? ?Social Determinants of Health  ? ?Financial Resource Strain: Not on file  ?Food Insecurity: Not on file  ?Transportation Needs: Not on file  ?Physical Activity: Not on file  ?Stress: Not on file  ?Social Connections: Not on file  ?Intimate Partner Violence: Not on file  ? ?Family Status  ?Relation Name Status  ? Mother  Alive  ? Father  Deceased  ? Sister  Deceased  ? Brother  Alive  ? MGM  Deceased  ? MGF  Deceased  ? PGM  Deceased  ? PGF  Deceased  ? Sister  Alive  ? Sister  Alive  ? Brother  Alive  ? Brother  Alive  ? Neg Hx  (Not Specified)  ? ?Family History  ?Problem Relation Age of Onset  ? Hypertension Mother   ? Hyperlipidemia Mother   ? Hypertension Father   ? Hyperlipidemia Father   ? Diabetes Mellitus I Sister   ? Diabetes Mellitus I Brother   ? Diabetes Mellitus II Paternal Grandfather   ? Healthy Sister   ?  Healthy Sister   ? Healthy Brother   ? Healthy Brother   ? Breast cancer Neg Hx   ? Colon cancer Neg Hx   ? Ovarian cancer Neg Hx   ? ?Allergies  ?Allergen Reactions  ? Ciprofloxacin Hives  ? Codeine Nausea And Vomiting  ? Gabapentin Nausea And Vomiting  ? Hydrocodone-Acetaminophen Nausea And Vomiting  ? Hydromorphone Hcl Nausea And Vomiting  ? Oxycodone-Acetaminophen Nausea And Vomiting  ? Promethazine Hcl Nausea And Vomiting  ? Tramadol Nausea And Vomiting  ? Sulfa Antibiotics Rash  ?  hives  ?  ?Patient Care Team: ?Emelia Loron as PCP - General (Physician Assistant)  ? ?Medications: ?Outpatient Medications Prior to Visit  ?Medication Sig  ? amLODipine (NORVASC) 5 MG tablet TAKE 1 TABLET BY MOUTH DAILY.  ? Blood Pressure Monitoring (OMRON 3 SERIES BP MONITOR) DEVI Use as directed  ? Drospirenone (SLYND) 4 MG TABS Take 1 tablet by mouth daily.  ?  influenza vac split quadrivalent PF (FLUARIX) 0.5 ML injection Inject into the muscle.  ? traZODone (DESYREL) 50 MG tablet Take 0.5-1 tablets (25-50 mg total) by mouth at bedtime as needed for sleep.  ? triamterene-hydrochlorothiazide (MAXZIDE-25) 37.5-25 MG tablet TAKE 1 TABLET BY MOUTH DAILY.  ? Vitamin D, Ergocalciferol, (DRISDOL) 1.25 MG (50000 UNIT) CAPS capsule TAKE 1 CAPSULE BY MOUTH EVERY 7 DAYS.  ? albuterol (VENTOLIN HFA) 108 (90 Base) MCG/ACT inhaler USE PRIOR TO EXERCISE AS DIRECTED  ? azelastine (ASTELIN) 0.1 % nasal spray PLACE 1 SPRAY INTO BOTH NOSTRILS 2 TIMES DAILY. USE IN EACH NOSTRIL AS DIRECTED  ? ?No facility-administered medications prior to visit.  ? ? ?Review of Systems  ?Constitutional: Negative.  Negative for fatigue and fever.  ?Eyes: Negative.   ?Respiratory:  Negative for cough and shortness of breath.   ?Cardiovascular: Negative.  Negative for chest pain and leg swelling.  ?Gastrointestinal: Negative.  Negative for abdominal pain.  ?Endocrine: Negative.   ?Genitourinary: Negative.   ?Musculoskeletal: Negative.   ?Skin: Negative.   ?Allergic/Immunologic: Negative.   ?Neurological:  Positive for facial asymmetry. Negative for dizziness and headaches.  ?Hematological: Negative.   ?Psychiatric/Behavioral: Negative.    ? ? ? Objective  ?  ?Blood pressure (!) 138/92, pulse 73, height 5' 4"  (1.626 m), weight 187 lb 4.8 oz (85 kg), SpO2 99 %.  ? ? ?Physical Exam ?Constitutional:   ?   General: She is awake.  ?   Appearance: She is well-developed.  ?HENT:  ?   Head: Normocephalic.  ?   Comments: Very slight differences in nasolabial fold, right side appears flatter. ?When smiling, right side appears slightly downturned.  ?   Right Ear: Tympanic membrane normal.  ?   Left Ear: Tympanic membrane normal.  ?Eyes:  ?   Conjunctiva/sclera: Conjunctivae normal.  ?   Pupils: Pupils are equal, round, and reactive to light.  ?Neck:  ?   Thyroid: No thyroid mass or thyromegaly.  ?Cardiovascular:  ?   Rate  and Rhythm: Normal rate and regular rhythm.  ?   Heart sounds: Normal heart sounds.  ?Pulmonary:  ?   Effort: Pulmonary effort is normal.  ?   Breath sounds: Normal breath sounds.  ?Abdominal:  ?   Palpations: Abdomen is soft.  ?   Tenderness: There is no abdominal tenderness.  ?Musculoskeletal:  ?   Right lower leg: No swelling. No edema.  ?   Left lower leg: No swelling. No edema.  ?Lymphadenopathy:  ?   Cervical: No  cervical adenopathy.  ?Skin: ?   General: Skin is warm.  ?Neurological:  ?   Mental Status: She is alert and oriented to person, place, and time.  ?Psychiatric:     ?   Attention and Perception: Attention normal.     ?   Mood and Affect: Mood normal.     ?   Speech: Speech normal.     ?   Behavior: Behavior is cooperative.  ?  ?Last depression screening scores ? ?  02/07/2021  ? 10:49 AM 05/24/2020  ?  8:26 AM 05/19/2019  ?  7:37 AM  ?PHQ 2/9 Scores  ?PHQ - 2 Score 2 0 0  ?PHQ- 9 Score 11 0   ? ?Last fall risk screening ? ?  02/07/2021  ? 10:49 AM  ?Fall Risk   ?Falls in the past year? 0  ?Number falls in past yr: 0  ?Injury with Fall? 0  ?Risk for fall due to : No Fall Risks  ?Follow up Falls evaluation completed  ? ?Last Audit-C alcohol use screening ? ?  05/30/2021  ?  8:39 AM  ?Alcohol Use Disorder Test (AUDIT)  ?1. How often do you have a drink containing alcohol? 1  ?2. How many drinks containing alcohol do you have on a typical day when you are drinking? 0  ?3. How often do you have six or more drinks on one occasion? 0  ?AUDIT-C Score 1  ? ?A score of 3 or more in women, and 4 or more in men indicates increased risk for alcohol abuse, EXCEPT if all of the points are from question 1  ? ?No results found for any visits on 05/30/21. ? Assessment & Plan  ?  ?Routine Health Maintenance and Physical Exam ? ?Exercise Activities and Dietary recommendations ?--balanced diet high in fiber and protein, low in sugars, carbs, fats. ?--physical activity/exercise 30 minutes 3-5 times a week  ? ? ? ?Immunization  History  ?Administered Date(s) Administered  ? Influenza,inj,Quad PF,6+ Mos 01/02/2021  ? Influenza-Unspecified 11/17/2016, 12/22/2017  ? PFIZER(Purple Top)SARS-COV-2 Vaccination 04/26/2019, 05/18/2019  ? P

## 2021-05-30 ENCOUNTER — Ambulatory Visit (INDEPENDENT_AMBULATORY_CARE_PROVIDER_SITE_OTHER): Payer: No Typology Code available for payment source | Admitting: Physician Assistant

## 2021-05-30 ENCOUNTER — Encounter: Payer: Self-pay | Admitting: Physician Assistant

## 2021-05-30 ENCOUNTER — Other Ambulatory Visit: Payer: Self-pay

## 2021-05-30 VITALS — BP 138/92 | HR 73 | Ht 64.0 in | Wt 187.3 lb

## 2021-05-30 DIAGNOSIS — F519 Sleep disorder not due to a substance or known physiological condition, unspecified: Secondary | ICD-10-CM

## 2021-05-30 DIAGNOSIS — I10 Essential (primary) hypertension: Secondary | ICD-10-CM

## 2021-05-30 DIAGNOSIS — Z0000 Encounter for general adult medical examination without abnormal findings: Secondary | ICD-10-CM

## 2021-05-30 DIAGNOSIS — R7303 Prediabetes: Secondary | ICD-10-CM

## 2021-05-30 DIAGNOSIS — E7800 Pure hypercholesterolemia, unspecified: Secondary | ICD-10-CM | POA: Diagnosis not present

## 2021-05-30 DIAGNOSIS — Q670 Congenital facial asymmetry: Secondary | ICD-10-CM | POA: Diagnosis not present

## 2021-05-30 DIAGNOSIS — E559 Vitamin D deficiency, unspecified: Secondary | ICD-10-CM | POA: Insufficient documentation

## 2021-05-30 DIAGNOSIS — Z1159 Encounter for screening for other viral diseases: Secondary | ICD-10-CM

## 2021-05-30 DIAGNOSIS — Z6832 Body mass index (BMI) 32.0-32.9, adult: Secondary | ICD-10-CM

## 2021-05-30 NOTE — Assessment & Plan Note (Signed)
Pt reports lower at home values. ?Will continue to monitor ?Continue amlodipine 5 mg and triamterene hctz 37.5 / 25 mg ? ?

## 2021-05-30 NOTE — Assessment & Plan Note (Signed)
Will recheck A1c ?Discussed diet/exercise ?Will ref to dietician ?

## 2021-05-30 NOTE — Assessment & Plan Note (Signed)
Length discussion, history of insomnia, sleep hygiene.  ?Ref pt to psychiatry.  ?In the time being can increase trazodone from 50 mg nightly to 100 mg.  ?

## 2021-05-30 NOTE — Addendum Note (Signed)
Addended byMikey Kirschner on: 05/30/2021 11:11 AM ? ? Modules accepted: Orders ? ?

## 2021-05-30 NOTE — Assessment & Plan Note (Signed)
Very mild today. ?More pronounced in photos from Fall 2022. ?Pt sees Dr Manuella Ghazi, advised she call for f/u to discuss.  ?At this point out of range for steroids for Bells Palsy ?

## 2021-05-30 NOTE — Assessment & Plan Note (Signed)
Historically elevated ?The 10-year ASCVD risk score (Arnett DK, et al., 2019) is: 5.7% ?Discussed diet/exercise. ?Will recheck lipids ?

## 2021-05-30 NOTE — Assessment & Plan Note (Signed)
Historically, will recheck ?Currently does not take a supplement ?

## 2021-05-31 LAB — CBC WITH DIFFERENTIAL/PLATELET
Basophils Absolute: 0 10*3/uL (ref 0.0–0.2)
Basos: 0 %
EOS (ABSOLUTE): 0.2 10*3/uL (ref 0.0–0.4)
Eos: 3 %
Hematocrit: 43.3 % (ref 34.0–46.6)
Hemoglobin: 14.5 g/dL (ref 11.1–15.9)
Immature Grans (Abs): 0 10*3/uL (ref 0.0–0.1)
Immature Granulocytes: 0 %
Lymphocytes Absolute: 3.1 10*3/uL (ref 0.7–3.1)
Lymphs: 38 %
MCH: 29.2 pg (ref 26.6–33.0)
MCHC: 33.5 g/dL (ref 31.5–35.7)
MCV: 87 fL (ref 79–97)
Monocytes Absolute: 0.7 10*3/uL (ref 0.1–0.9)
Monocytes: 8 %
Neutrophils Absolute: 4.1 10*3/uL (ref 1.4–7.0)
Neutrophils: 51 %
Platelets: 330 10*3/uL (ref 150–450)
RBC: 4.96 x10E6/uL (ref 3.77–5.28)
RDW: 12.4 % (ref 11.7–15.4)
WBC: 8.2 10*3/uL (ref 3.4–10.8)

## 2021-05-31 LAB — COMPREHENSIVE METABOLIC PANEL
ALT: 33 IU/L — ABNORMAL HIGH (ref 0–32)
AST: 19 IU/L (ref 0–40)
Albumin/Globulin Ratio: 1.3 (ref 1.2–2.2)
Albumin: 3.8 g/dL (ref 3.8–4.9)
Alkaline Phosphatase: 80 IU/L (ref 44–121)
BUN/Creatinine Ratio: 8 — ABNORMAL LOW (ref 9–23)
BUN: 8 mg/dL (ref 6–24)
Bilirubin Total: 0.3 mg/dL (ref 0.0–1.2)
CO2: 24 mmol/L (ref 20–29)
Calcium: 9.1 mg/dL (ref 8.7–10.2)
Chloride: 105 mmol/L (ref 96–106)
Creatinine, Ser: 0.95 mg/dL (ref 0.57–1.00)
Globulin, Total: 2.9 g/dL (ref 1.5–4.5)
Glucose: 87 mg/dL (ref 70–99)
Potassium: 4.5 mmol/L (ref 3.5–5.2)
Sodium: 145 mmol/L — ABNORMAL HIGH (ref 134–144)
Total Protein: 6.7 g/dL (ref 6.0–8.5)
eGFR: 72 mL/min/{1.73_m2} (ref >59)

## 2021-05-31 LAB — LIPID PANEL
Chol/HDL Ratio: 4.3 ratio (ref 0.0–4.4)
Cholesterol, Total: 221 mg/dL — ABNORMAL HIGH (ref 100–199)
HDL: 51 mg/dL (ref >39)
LDL Chol Calc (NIH): 146 mg/dL — ABNORMAL HIGH (ref 0–99)
Triglycerides: 135 mg/dL (ref 0–149)
VLDL Cholesterol Cal: 24 mg/dL (ref 5–40)

## 2021-05-31 LAB — HEPATITIS C ANTIBODY: Hep C Virus Ab: NONREACTIVE

## 2021-05-31 LAB — VITAMIN D 25 HYDROXY (VIT D DEFICIENCY, FRACTURES): Vit D, 25-Hydroxy: 31.1 ng/mL (ref 30.0–100.0)

## 2021-05-31 LAB — HEMOGLOBIN A1C
Est. average glucose Bld gHb Est-mCnc: 117 mg/dL
Hgb A1c MFr Bld: 5.7 % — ABNORMAL HIGH (ref 4.8–5.6)

## 2021-06-01 ENCOUNTER — Other Ambulatory Visit: Payer: Self-pay | Admitting: Physician Assistant

## 2021-06-01 DIAGNOSIS — J45990 Exercise induced bronchospasm: Secondary | ICD-10-CM

## 2021-06-02 ENCOUNTER — Other Ambulatory Visit: Payer: Self-pay

## 2021-06-03 ENCOUNTER — Other Ambulatory Visit: Payer: Self-pay

## 2021-06-03 MED ORDER — SLYND 4 MG PO TABS
1.0000 | ORAL_TABLET | Freq: Every day | ORAL | 11 refills | Status: DC
Start: 2021-06-03 — End: 2021-06-21

## 2021-06-03 MED ORDER — ALBUTEROL SULFATE HFA 108 (90 BASE) MCG/ACT IN AERS
INHALATION_SPRAY | RESPIRATORY_TRACT | 0 refills | Status: DC
  Filled 2021-06-03 – 2021-06-18 (×2): qty 18, 30d supply, fill #0

## 2021-06-04 ENCOUNTER — Other Ambulatory Visit: Payer: Self-pay

## 2021-06-05 ENCOUNTER — Other Ambulatory Visit: Payer: Self-pay

## 2021-06-05 MED ORDER — SAXENDA 18 MG/3ML ~~LOC~~ SOPN
PEN_INJECTOR | SUBCUTANEOUS | 6 refills | Status: DC
  Filled 2021-06-05 – 2021-07-28 (×3): qty 15, 30d supply, fill #0

## 2021-06-05 NOTE — Telephone Encounter (Signed)
Error

## 2021-06-05 NOTE — Addendum Note (Signed)
Addended by: Augusto Gamble on: 06/05/2021 03:15 PM ? ? Modules accepted: Orders ? ?

## 2021-06-09 ENCOUNTER — Other Ambulatory Visit: Payer: Self-pay | Admitting: Student

## 2021-06-09 DIAGNOSIS — G7000 Myasthenia gravis without (acute) exacerbation: Secondary | ICD-10-CM

## 2021-06-13 ENCOUNTER — Other Ambulatory Visit: Payer: Self-pay

## 2021-06-16 ENCOUNTER — Encounter: Payer: Self-pay | Admitting: Physician Assistant

## 2021-06-17 ENCOUNTER — Other Ambulatory Visit: Payer: Self-pay

## 2021-06-17 ENCOUNTER — Ambulatory Visit
Admission: RE | Admit: 2021-06-17 | Discharge: 2021-06-17 | Disposition: A | Discharge status: Home / Self Care | Payer: No Typology Code available for payment source | Source: Ambulatory Visit | Attending: Student | Admitting: Student

## 2021-06-17 DIAGNOSIS — G7000 Myasthenia gravis without (acute) exacerbation: Secondary | ICD-10-CM | POA: Insufficient documentation

## 2021-06-17 MED ORDER — GADOBUTROL 1 MMOL/ML IV SOLN
9.0000 mL | Freq: Once | INTRAVENOUS | Status: AC | PRN
  Administered 2021-06-17: 9 mL via INTRAVENOUS

## 2021-06-18 ENCOUNTER — Other Ambulatory Visit: Payer: Self-pay

## 2021-06-20 ENCOUNTER — Other Ambulatory Visit: Payer: Self-pay

## 2021-06-20 NOTE — Telephone Encounter (Signed)
According to [facility] patient has to try Norethindrone first. I was not sure of which one you wanted. It has to be Norethindrone by itself. She does want it sent to [facility] because of the cost.  ?

## 2021-06-21 MED ORDER — NORETHINDRONE 0.35 MG PO TABS
1.0000 | ORAL_TABLET | Freq: Every day | ORAL | 3 refills | Status: DC
  Filled 2021-06-21: qty 84, 84d supply, fill #0
  Filled 2021-09-22: qty 84, 84d supply, fill #1

## 2021-06-21 NOTE — Addendum Note (Signed)
Addended by: Augusto Gamble on: 06/21/2021 10:03 AM ? ? Modules accepted: Orders ? ?

## 2021-06-23 ENCOUNTER — Other Ambulatory Visit: Payer: Self-pay

## 2021-06-24 ENCOUNTER — Other Ambulatory Visit: Payer: Self-pay

## 2021-06-27 ENCOUNTER — Ambulatory Visit
Admission: RE | Admit: 2021-06-27 | Discharge: 2021-06-27 | Disposition: A | Discharge status: Home / Self Care | Payer: No Typology Code available for payment source | Source: Ambulatory Visit | Attending: Obstetrics and Gynecology | Admitting: Obstetrics and Gynecology

## 2021-06-27 ENCOUNTER — Encounter: Payer: No Typology Code available for payment source | Attending: Physician Assistant | Admitting: Dietician

## 2021-06-27 ENCOUNTER — Encounter: Payer: Self-pay | Admitting: Dietician

## 2021-06-27 VITALS — Ht 65.0 in | Wt 183.7 lb

## 2021-06-27 DIAGNOSIS — Z01419 Encounter for gynecological examination (general) (routine) without abnormal findings: Secondary | ICD-10-CM | POA: Diagnosis present

## 2021-06-27 DIAGNOSIS — E7800 Pure hypercholesterolemia, unspecified: Secondary | ICD-10-CM

## 2021-06-27 DIAGNOSIS — Z1231 Encounter for screening mammogram for malignant neoplasm of breast: Secondary | ICD-10-CM | POA: Diagnosis present

## 2021-06-27 DIAGNOSIS — R7303 Prediabetes: Secondary | ICD-10-CM

## 2021-06-27 DIAGNOSIS — I10 Essential (primary) hypertension: Secondary | ICD-10-CM

## 2021-06-27 DIAGNOSIS — E669 Obesity, unspecified: Secondary | ICD-10-CM

## 2021-06-27 NOTE — Patient Instructions (Signed)
Aim for about 1400-1500 calories daily for weight loss.  ?If you need to increase some healthy carb foods, increase gradually; ie aim for 15-30grams with each meal and slowly build to 30-45grams. ?Great job making healthy food choices and incorporating exercise! Keep it up! ?

## 2021-06-27 NOTE — Progress Notes (Signed)
Medical Nutrition Therapy: Visit start time: 0900  end time: 1000  ?Assessment:  Diagnosis: pre-diabetes, hyperlipidemia, obesity ?Past medical history: hypertension ?Psychosocial issues/ stress concerns: none ? ?Preferred learning method:  ?Hands-on ? ? ?Current weight: 183.7lbs Height: 5'5" BMI: 30.57 ? ?Medications, supplements: reconciled list in medical record ? ?Progress and evaluation:  ?Patient reports HTN is currently well controlled with medications ?Recent labwork (05/30/21) indicates HbA1C  5.7%;  total chol 221, HDL 51, LDL 146, triglycerides 135, Vit D 31.1 up from 16.7 in 2022 ?She is following mostly plant based eating during week, some meats on weekends ? ? ?Physical activity: cardio, strength exercise 30-45 minutes, 4 times per week ? ?Dietary Intake:  ?Usual eating pattern includes 3 meals and 0-2 snacks per day. ?Dining out frequency: 3-4 meals per week. ? ?Breakfast: smoothie, oatmeal; vegan sausage with spinach and peppers ?Snack: none or same as pm ?Lunch: usu salad with protein (egg or cheese), or veg plate, occ  with veggie burger without bread ?Snack: apple/ orange; carrots with hummus; occ popcorn or chips ?Supper: same as lunch ?Snack: none ?Beverages: mostly water, occ pepsi or lemonade ? ?Intervention:  ? ?Nutrition Care Education: ?  ?Basic nutrition: basic food groups; appropriate nutrient balance; appropriate meal and snack schedule; general nutrition guidelines including Mediterranean eating pattern for overall health benefit ?Weight control:  determining reasonable weight loss rate, importance of low sugar and low fat choices, appropriate fppd portions; estimated energy needs for weight loss at 1400-1500 kcal, provided guidance for 50% CHO, 20% protein, 30% fat ?Pre-diabetes:  appropriate carb intake and balance, healthy carb choices, role of fiber, protein, fat; benefits of regular exercise ?Hyperlipidemia:  healthy and unhealthy fats, role of fiber, role of  exercise ? ? ? ?Nutritional Diagnosis:  [place]-2.2 Altered nutrition-related laboratory As related to hyperlipidemia, pre-diabetes.  As evidenced by slightly elevated HbA1C, elevated total cholesterol and LDL. ?-3.3 Overweight/obesity As related to history of excess calories and inadequate physical activity.  As evidenced by patient with current BMI of 30.57, making appropriate diet and lifestyle changes to promote weight loss and reduce health risk. ? ? ?Education Materials given:  ?Plate Planner with food lists, sample meal pattern ?Sample menus ?Get Healthy with Mediterranean Style Eating  ?Visit summary with goals/ instructions to be viewed via MyChart ? ? ?Learner/ who was taught:  ?Patient  ? ?Level of understanding: ?Verbalizes/ demonstrates competency ? ? ?Demonstrated degree of understanding via:   Teach back ?Learning barriers: ?None ? ?Willingness to learn/ readiness for change: ?Eager, change in progress with motivation to continue ? ?Monitoring and Evaluation:  Dietary intake, exercise, BG control, blood lipids, and body weight ?     follow up: prn  ?

## 2021-07-03 ENCOUNTER — Other Ambulatory Visit: Payer: Self-pay | Admitting: Physician Assistant

## 2021-07-04 ENCOUNTER — Encounter: Payer: Self-pay | Admitting: Gastroenterology

## 2021-07-04 ENCOUNTER — Ambulatory Visit
Admission: RE | Admit: 2021-07-04 | Discharge: 2021-07-04 | Disposition: A | Discharge status: Home / Self Care | Payer: No Typology Code available for payment source | Attending: Gastroenterology | Admitting: Gastroenterology

## 2021-07-04 ENCOUNTER — Ambulatory Visit: Payer: No Typology Code available for payment source | Admitting: Anesthesiology

## 2021-07-04 ENCOUNTER — Encounter
Admission: RE | Disposition: A | Discharge status: Home / Self Care | Payer: Self-pay | Source: Home / Self Care | Attending: Gastroenterology

## 2021-07-04 DIAGNOSIS — K635 Polyp of colon: Secondary | ICD-10-CM

## 2021-07-04 DIAGNOSIS — Z1211 Encounter for screening for malignant neoplasm of colon: Secondary | ICD-10-CM | POA: Diagnosis present

## 2021-07-04 DIAGNOSIS — K5730 Diverticulosis of large intestine without perforation or abscess without bleeding: Secondary | ICD-10-CM | POA: Insufficient documentation

## 2021-07-04 DIAGNOSIS — D124 Benign neoplasm of descending colon: Secondary | ICD-10-CM | POA: Insufficient documentation

## 2021-07-04 DIAGNOSIS — D123 Benign neoplasm of transverse colon: Secondary | ICD-10-CM | POA: Diagnosis not present

## 2021-07-04 DIAGNOSIS — I10 Essential (primary) hypertension: Secondary | ICD-10-CM | POA: Insufficient documentation

## 2021-07-04 DIAGNOSIS — Z800 Family history of malignant neoplasm of digestive organs: Secondary | ICD-10-CM

## 2021-07-04 HISTORY — PX: COLONOSCOPY WITH PROPOFOL: SHX5780

## 2021-07-04 SURGERY — COLONOSCOPY WITH PROPOFOL
Anesthesia: General

## 2021-07-04 MED ORDER — PROPOFOL 10 MG/ML IV BOLUS
INTRAVENOUS | Status: DC | PRN
  Administered 2021-07-04: 130 ug/kg/min via INTRAVENOUS

## 2021-07-04 MED ORDER — ONDANSETRON HCL 4 MG/2ML IJ SOLN: 4.0000 mg | Freq: Once | INTRAMUSCULAR | Status: DC

## 2021-07-04 MED ORDER — STERILE WATER FOR IRRIGATION IR SOLN
Status: DC | PRN
  Administered 2021-07-04: 120 mL

## 2021-07-04 MED ORDER — ONDANSETRON HCL 4 MG/2ML IJ SOLN
4.0000 mg | Freq: Once | INTRAMUSCULAR | Status: AC
  Administered 2021-07-04: 4 mg via INTRAVENOUS

## 2021-07-04 MED ORDER — LIDOCAINE HCL (CARDIAC) PF 100 MG/5ML IV SOSY
PREFILLED_SYRINGE | INTRAVENOUS | Status: DC | PRN
  Administered 2021-07-04: 60 mg via INTRAVENOUS

## 2021-07-04 MED ORDER — GLYCOPYRROLATE 0.2 MG/ML IJ SOLN
INTRAMUSCULAR | Status: DC | PRN
  Administered 2021-07-04: .2 mg via INTRAVENOUS

## 2021-07-04 MED ORDER — SODIUM CHLORIDE 0.9 % IV SOLN: INTRAVENOUS | Status: DC

## 2021-07-04 NOTE — Op Note (Signed)
[facility] ?Gastroenterology ?Patient Name: Ashley [name] ?Procedure Date: 07/04/2021 8:55 AM ?MRN: [id number] ?Account #: [id number] ?Date of Birth: [birth year] ?Admit Type: Outpatient ?Age: 51 ?Room: [facility] ENDO ROOM 4 ?Gender: Female ?Note Status: Finalized ?Instrument Name: Colonscope [id number] ?Procedure:             Colonoscopy ?Indications:           Screening for colorectal malignant neoplasm, This is  ?                       the patient's first colonoscopy ?Providers:             Lin Landsman MD, MD ?Referring MD:          Forest Gleason Md, MD (Referring MD) ?Medicines:             General Anesthesia ?Complications:         No immediate complications. Estimated blood loss: None. ?Procedure:             Pre-Anesthesia Assessment: ?                       - Prior to the procedure, a History and Physical was  ?                       performed, and patient medications and allergies were  ?                       reviewed. The patient is competent. The risks and  ?                       benefits of the procedure and the sedation options and  ?                       risks were discussed with the patient. All questions  ?                       were answered and informed consent was obtained.  ?                       Patient identification and proposed procedure were  ?                       verified by the physician, the nurse, the  ?                       anesthesiologist, the anesthetist and the technician  ?                       in the pre-procedure area in the procedure room in the  ?                       endoscopy suite. Mental Status Examination: alert and  ?                       oriented. Airway Examination: normal oropharyngeal  ?                       airway and neck mobility. Respiratory Examination:  ?  clear to auscultation. CV Examination: normal.  ?                       Prophylactic Antibiotics: The patient does not require  ?                       prophylactic  antibiotics. Prior Anticoagulants: The  ?                       patient has taken no previous anticoagulant or  ?                       antiplatelet agents. ASA Grade Assessment: II - A  ?                       patient with mild systemic disease. After reviewing  ?                       the risks and benefits, the patient was deemed in  ?                       satisfactory condition to undergo the procedure. The  ?                       anesthesia plan was to use general anesthesia.  ?                       Immediately prior to administration of medications,  ?                       the patient was re-assessed for adequacy to receive  ?                       sedatives. The heart rate, respiratory rate, oxygen  ?                       saturations, blood pressure, adequacy of pulmonary  ?                       ventilation, and response to care were monitored  ?                       throughout the procedure. The physical status of the  ?                       patient was re-assessed after the procedure. ?                       After obtaining informed consent, the colonoscope was  ?                       passed under direct vision. Throughout the procedure,  ?                       the patient's blood pressure, pulse, and oxygen  ?                       saturations were monitored continuously. The  ?  Colonoscope was introduced through the anus and  ?                       advanced to the the cecum, identified by appendiceal  ?                       orifice and ileocecal valve. The colonoscopy was  ?                       performed without difficulty. The patient tolerated  ?                       the procedure well. The quality of the bowel  ?                       preparation was evaluated using the BBPS [facility] Bowel  ?                       Preparation Scale) with scores of: Right Colon = 3,  ?                       Transverse Colon = 3 and Left Colon = 3 (entire mucosa  ?                        seen well with no residual staining, small fragments  ?                       of stool or opaque liquid). The total BBPS score  ?                       equals 9. ?Findings: ?     The perianal and digital rectal examinations were normal. Pertinent  ?     negatives include normal sphincter tone and no palpable rectal lesions. ?     Two sessile polyps were found in the transverse colon. The polyps were 4  ?     to 5 mm in size. These polyps were removed with a cold snare. Resection  ?     and retrieval were complete. ?     A diminutive polyp was found in the descending colon. The polyp was  ?     sessile. The polyp was removed with a jumbo cold forceps. Resection and  ?     retrieval were complete. ?     A 3 mm polyp was found in the descending colon. The polyp was sessile.  ?     The polyp was removed with a cold snare. Resection and retrieval were  ?     complete. ?     The retroflexed view of the distal rectum and anal verge was normal and  ?     showed no anal or rectal abnormalities. ?     A few large-mouthed diverticula were found in the sigmoid colon,  ?     transverse colon and ascending colon. ?Impression:            - Two 4 to 5 mm polyps in the transverse colon,  ?                       removed with a cold snare. Resected  and retrieved. ?                       - One diminutive polyp in the descending colon,  ?                       removed with a jumbo cold forceps. Resected and  ?                       retrieved. ?                       - One 3 mm polyp in the descending colon, removed with  ?                       a cold snare. Resected and retrieved. ?                       - The distal rectum and anal verge are normal on  ?                       retroflexion view. ?                       - Moderate diverticulosis in the sigmoid colon, in the  ?                       transverse colon and in the ascending colon. ?Recommendation:        - Discharge patient to home (with escort). ?                       -  Resume previous diet today. ?                       - Continue present medications. ?                       - Await pathology results. ?                       - Repeat colonoscopy in 3 - 5 years for surveillance  ?                       based on pathology results. ?Procedure Code(s):     --- Professional --- ?                       [phone], Colonoscopy, flexible; with removal of  ?                       tumor(s), polyp(s), or other lesion(s) by snare  ?                       technique ?                       [id number], 59, Colonoscopy, flexible; with biopsy, single  ?                       or multiple ?Diagnosis Code(s):     --- Professional --- ?  K63.5, Polyp of colon ?                       K57.30, Diverticulosis of large intestine without  ?                       perforation or abscess without bleeding ?                       Z12.11, Encounter for screening for malignant neoplasm  ?                       of colon ?CPT copyright 2019 American Medical Association. All rights reserved. ?The codes documented in this report are preliminary and upon coder review may  ?be revised to meet current compliance requirements. ?Dr. Ulyess Mort ?Telia Amundson Raeanne Gathers MD, MD ?07/04/2021 9:25:56 AM ?This report has been signed electronically. ?Number of Addenda: 0 ?Note Initiated On: 07/04/2021 8:55 AM ?Scope Withdrawal Time: 0 hours 12 minutes 22 seconds  ?Total Procedure Duration: 0 hours 15 minutes 24 seconds  ?Estimated Blood Loss:  Estimated blood loss: none. ?     [facility] ?

## 2021-07-04 NOTE — Anesthesia Preprocedure Evaluation (Signed)
Anesthesia Evaluation  ?Patient identified by MRN, date of birth, ID band ?Patient awake ? ? ? ?Reviewed: ?Allergy & Precautions, NPO status , Patient's Chart, lab work & pertinent test results ? ?Airway ?Mallampati: II ? ?TM Distance: >3 FB ?Neck ROM: full ? ? ? Dental ? ?(+) Chipped ?  ?Pulmonary ?neg pulmonary ROS,  ?  ?Pulmonary exam normal ? ? ? ? ? ? ? Cardiovascular ?hypertension, Normal cardiovascular exam ? ? ?  ?Neuro/Psych ?negative neurological ROS ? negative psych ROS  ? GI/Hepatic ?negative GI ROS, Neg liver ROS,   ?Endo/Other  ?negative endocrine ROS ? Renal/GU ?negative Renal ROS  ?negative genitourinary ?  ?Musculoskeletal ? ? Abdominal ?Normal abdominal exam  (+)   ?Peds ? Hematology ?negative hematology ROS ?(+)   ?Anesthesia Other Findings ?Past Medical History: ?No date: Brain hemangioma ([place]) ?    Comment:  Benig in 2018, never went to get head MRI ?No date: Hypertension ?No date: Migraine ? ?Past Surgical History: ?06/03/2018: BREAST BIOPSY; Right ?    Comment:  X clip stereo bx CALCIUM OXALATE CRYSTALS WITHIN  ?             APOCRINE MICROCYSTS USUAL DUCTAL HYPERPLASIA, COLUMNAR  ?             CELL CHANGE, AND FOCAL PSEUDOANGIOMATOUS STROMAL  ?             HYPERPLASIA ([place] ?10+ yrs ago: BREAST CYST ASPIRATION; Right ?No date: CHOLECYSTECTOMY ?No date: FOOT SURGERY ? ?BMI   ? Body Mass Index: 31.01 kg/m?  ?  ? ? Reproductive/Obstetrics ?negative OB ROS ? ?  ? ? ? ? ? ? ? ? ? ? ? ? ? ?  ?  ? ? ? ? ? ? ? ? ?Anesthesia Physical ?Anesthesia Plan ? ?ASA: 2 ? ?Anesthesia Plan: General  ? ?Post-op Pain Management:   ? ?Induction: Intravenous ? ?PONV Risk Score and Plan: Propofol infusion and TIVA ? ?Airway Management Planned: Natural Airway ? ?Additional Equipment:  ? ?Intra-op Plan:  ? ?Post-operative Plan:  ? ?Informed Consent: I have reviewed the patients History and Physical, chart, labs and discussed the procedure including the risks, benefits and alternatives  for the proposed anesthesia with the patient or authorized representative who has indicated his/her understanding and acceptance.  ? ? ? ?Dental Advisory Given ? ?Plan Discussed with: Anesthesiologist, CRNA and Surgeon ? ?Anesthesia Plan Comments:   ? ? ? ? ? ? ?Anesthesia Quick Evaluation ? ?

## 2021-07-04 NOTE — Transfer of Care (Signed)
Immediate Anesthesia Transfer of Care Note ? ?Patient: Ashley [name] ? ?Procedure(s) Performed: COLONOSCOPY WITH PROPOFOL ? ?Patient Location: PACU ? ?Anesthesia Type:General ? ?Level of Consciousness: awake ? ?Airway & Oxygen Therapy: Patient Spontanous Breathing ? ?Post-op Assessment: Report given to RN and Post -op Vital signs reviewed and stable ? ?Post vital signs: Reviewed and stable ? ?Last Vitals:  ?Vitals Value Taken Time  ?BP 110/66 07/04/21 0928  ?Temp 35.9   ?Pulse 83 07/04/21 0929  ?Resp 27 07/04/21 0929  ?SpO2 100 % 07/04/21 0929  ?Vitals shown include unvalidated device data. ? ?Last Pain:  ?Vitals:  ? 07/04/21 0928  ?TempSrc:   ?PainSc: 0-No pain  ?   ? ?  ? ?Complications: No notable events documented. ?

## 2021-07-04 NOTE — Anesthesia Postprocedure Evaluation (Signed)
Anesthesia Post Note ? ?Patient: Ashley [name] ? ?Procedure(s) Performed: COLONOSCOPY WITH PROPOFOL ? ?Patient location during evaluation: Endoscopy ?Anesthesia Type: General ?Level of consciousness: awake and alert ?Pain management: pain level controlled ?Vital Signs Assessment: post-procedure vital signs reviewed and stable ?Respiratory status: spontaneous breathing, nonlabored ventilation and respiratory function stable ?Cardiovascular status: blood pressure returned to baseline and stable ?Postop Assessment: no apparent nausea or vomiting ?Anesthetic complications: no ? ? ?No notable events documented. ? ? ?Last Vitals:  ?Vitals:  ? 07/04/21 1026 07/04/21 1036  ?BP: 114/76 110/75  ?Pulse:    ?Resp:    ?Temp:    ?SpO2:    ?  ?Last Pain:  ?Vitals:  ? 07/04/21 1050  ?TempSrc:   ?PainSc: 0-No pain  ? ? ?  ?  ?  ?  ?  ?  ? ?[place] Ouch ? ? ? ? ?

## 2021-07-04 NOTE — H&P (Signed)
?Cephas Darby, MD ?[address]  ?[address]  ?[place], [place] [address]  ?Main: [phone]  ?Fax: [phone] ?Pager: [phone] ? ?Primary Care Physician:  Mikey Kirschner, PA-C ?Primary Gastroenterologist:  Dr. Cephas Darby ? ?Pre-Procedure History & Physical: ?HPI:  Ashley [name] is a 51 y.o. female is here for an colonoscopy. ?  ?Past Medical History:  ?Diagnosis Date  ? Brain hemangioma ([place])   ? [place] in 2018, never went to get head MRI  ? Hypertension   ? Migraine   ? ? ?Past Surgical History:  ?Procedure Laterality Date  ? BREAST BIOPSY Right 06/03/2018  ? X clip stereo bx CALCIUM OXALATE CRYSTALS WITHIN APOCRINE MICROCYSTS USUAL DUCTAL HYPERPLASIA, COLUMNAR CELL CHANGE, AND FOCAL PSEUDOANGIOMATOUS STROMAL HYPERPLASIA ([place]  ? BREAST CYST ASPIRATION Right 10+ yrs ago  ? CHOLECYSTECTOMY    ? FOOT SURGERY    ? ? ?Prior to Admission medications   ?Medication Sig Start Date End Date Taking? Authorizing Provider  ?albuterol (VENTOLIN HFA) 108 (90 Base) MCG/ACT inhaler USE PRIOR TO EXERCISE AS DIRECTED 06/03/21 06/03/22  Mikey Kirschner, PA-C  ?amLODipine (NORVASC) 5 MG tablet TAKE 1 TABLET BY MOUTH DAILY. 05/27/21 05/27/22  Mikey Kirschner, PA-C  ?azelastine (ASTELIN) 0.1 % nasal spray PLACE 1 SPRAY INTO BOTH NOSTRILS 2 TIMES DAILY. USE IN [facility] NOSTRIL AS DIRECTED 06/14/19 02/07/21  Mar Daring, PA-C  ?Blood Pressure Monitoring (OMRON 3 SERIES BP MONITOR) DEVI Use as directed 04/23/21   Letta Median, RPH  ?ergocalciferol (VITAMIN D2) 1.25 MG (50000 UT) capsule  08/22/20   [provider]  ?influenza vac split quadrivalent PF (FLUARIX) 0.5 ML injection Inject into the muscle. 01/02/21     ?Liraglutide -Weight Management (SAXENDA) 18 MG/3ML SOPN Begin with 0.6 ml daily on Week 1. Increase to 1.2 ml daily on Week 2. Increase to 1.8 ml daily on Week 3. Increase to 2.4 ml daily on week 4. Increase to 3 ml daily on Week 5 and continue., ?Patient not taking: Reported on 06/27/2021 06/05/21    Rubie Maid, MD  ?norethindrone (MICRONOR) 0.35 MG tablet Take 1 tablet (0.35 mg total) by mouth daily. 06/21/21   Rubie Maid, MD  ?traZODone (DESYREL) 50 MG tablet Take 0.5-1 tablets (25-50 mg total) by mouth at bedtime as needed for sleep. 02/07/21   Virginia Crews, MD  ?triamterene-hydrochlorothiazide (MAXZIDE-25) 37.5-25 MG tablet TAKE 1 TABLET BY MOUTH DAILY. 05/27/21 05/27/22  Mikey Kirschner, PA-C  ?phentermine (ADIPEX-P) 37.5 MG tablet Take 1 tablet (37.5 mg total) by mouth daily before breakfast. ?Patient not taking: Reported on 09/15/2019 05/19/19 09/25/19  Mar Daring, PA-C  ? ? ?Allergies as of 05/23/2021 - Review Complete 05/21/2021  ?Allergen Reaction Noted  ? Ciprofloxacin Hives 08/07/2014  ? Codeine Nausea And Vomiting 08/07/2014  ? Gabapentin Nausea And Vomiting 08/07/2014  ? Hydrocodone-acetaminophen Nausea And Vomiting 08/07/2014  ? Hydromorphone hcl Nausea And Vomiting 08/07/2014  ? Oxycodone-acetaminophen Nausea And Vomiting 08/07/2014  ? Promethazine hcl Nausea And Vomiting 08/07/2014  ? Tramadol Nausea And Vomiting 08/07/2014  ? Sulfa antibiotics Rash 10/12/2017  ? ? ?Family History  ?Problem Relation Age of Onset  ? Hypertension Mother   ? Hyperlipidemia Mother   ? Hypertension Father   ? Hyperlipidemia Father   ? Diabetes Mellitus I Sister   ? Diabetes Mellitus I Brother   ? Diabetes Mellitus II Paternal Grandfather   ? Healthy Sister   ? Healthy Sister   ? Healthy Brother   ? Healthy Brother   ?  Breast cancer Neg Hx   ? Colon cancer Neg Hx   ? Ovarian cancer Neg Hx   ? ? ?Social History  ? ?Socioeconomic History  ? Marital status: Single  ?  Spouse name: Not on file  ? Number of children: Not on file  ? Years of education: Not on file  ? Highest education level: Not on file  ?Occupational History  ? Not on file  ?Tobacco Use  ? Smoking status: Never  ? Smokeless tobacco: Never  ?Vaping Use  ? Vaping Use: Never used  ?Substance and Sexual Activity  ? Alcohol use: Yes  ?   Alcohol/week: 0.0 standard drinks  ?  Comment: rarely  ? Drug use: Never  ? Sexual activity: Not on file  ?Other Topics Concern  ? Not on file  ?Social History Narrative  ? Not on file  ? ?Social Determinants of Health  ? ?Financial Resource Strain: Not on file  ?Food Insecurity: Not on file  ?Transportation Needs: Not on file  ?Physical Activity: Not on file  ?Stress: Not on file  ?Social Connections: Not on file  ?Intimate Partner Violence: Not on file  ? ? ?Review of Systems: ?See HPI, otherwise negative ROS ? ?Physical Exam: ?BP 131/81   Pulse 77   Temp (!) 96.3 ?F (35.7 ?C) (Temporal)   Resp 16   Ht 5' 5"  (1.651 m)   Wt 84.5 kg   LMP  (LMP Unknown) Comment: pregnancy test negative  SpO2 100%   BMI 31.01 kg/m?  ?General:   Alert,  pleasant and cooperative in NAD ?Head:  Normocephalic and atraumatic. ?Neck:  Supple; no masses or thyromegaly. ?Lungs:  Clear throughout to auscultation.    ?Heart:  Regular rate and rhythm. ?Abdomen:  Soft, nontender and nondistended. Normal bowel sounds, without guarding, and without rebound.   ?Neurologic:  Alert and  oriented x4;  grossly normal neurologically. ? ?Impression/Plan: ?Shanetha Bradham is here for an colonoscopy to be performed for colon cancer screening ? ?Risks, benefits, limitations, and alternatives regarding  colonoscopy have been reviewed with the patient.  Questions have been answered.  All parties agreeable. ? ? ?Sherri Sear, MD  07/04/2021, 8:55 AM ?

## 2021-07-07 ENCOUNTER — Encounter: Payer: Self-pay | Admitting: Gastroenterology

## 2021-07-07 LAB — SURGICAL PATHOLOGY

## 2021-07-08 ENCOUNTER — Encounter: Payer: Self-pay | Admitting: Gastroenterology

## 2021-07-25 ENCOUNTER — Telehealth: Payer: Self-pay | Admitting: Gastroenterology

## 2021-07-25 NOTE — Telephone Encounter (Signed)
Medical records were sent out for 4/28 colonoscopy results to [facility]

## 2021-07-28 ENCOUNTER — Other Ambulatory Visit: Payer: Self-pay

## 2021-07-29 ENCOUNTER — Other Ambulatory Visit: Payer: Self-pay

## 2021-07-30 ENCOUNTER — Other Ambulatory Visit: Payer: Self-pay

## 2021-07-30 MED ORDER — UNIFINE PENTIPS 31G X 5 MM MISC
0 refills | Status: DC
  Filled 2021-07-30: qty 100, 90d supply, fill #0

## 2021-07-31 ENCOUNTER — Other Ambulatory Visit: Payer: Self-pay | Admitting: Obstetrics and Gynecology

## 2021-07-31 ENCOUNTER — Other Ambulatory Visit: Payer: Self-pay

## 2021-07-31 MED ORDER — SLYND 4 MG PO TABS
1.0000 | ORAL_TABLET | Freq: Every day | ORAL | 3 refills | Status: DC
  Filled 2021-07-31: qty 84, 84d supply, fill #0
  Filled 2021-09-22: qty 84, 84d supply, fill #1
  Filled 2021-12-26: qty 84, 84d supply, fill #2
  Filled 2022-04-29: qty 84, 84d supply, fill #3

## 2021-08-01 ENCOUNTER — Other Ambulatory Visit: Payer: Self-pay

## 2021-08-15 ENCOUNTER — Encounter: Payer: No Typology Code available for payment source | Admitting: Obstetrics and Gynecology

## 2021-08-21 ENCOUNTER — Other Ambulatory Visit: Payer: Self-pay

## 2021-08-21 ENCOUNTER — Encounter: Payer: Self-pay | Admitting: Physician Assistant

## 2021-08-21 ENCOUNTER — Ambulatory Visit (INDEPENDENT_AMBULATORY_CARE_PROVIDER_SITE_OTHER): Payer: No Typology Code available for payment source | Admitting: Physician Assistant

## 2021-08-21 VITALS — BP 128/81 | HR 92 | Ht 64.5 in | Wt 182.9 lb

## 2021-08-21 DIAGNOSIS — M25512 Pain in left shoulder: Secondary | ICD-10-CM

## 2021-08-21 MED ORDER — MELOXICAM 7.5 MG PO TABS
7.5000 mg | ORAL_TABLET | Freq: Two times a day (BID) | ORAL | 0 refills | Status: DC | PRN
  Filled 2021-08-21: qty 60, 30d supply, fill #0

## 2021-08-21 NOTE — Progress Notes (Signed)
Established patient visit   Patient: Ashley [name]   DOB: Jun 29, 1970   51 y.o. Female  MRN: [id number] Visit Date: 08/21/2021  Today's healthcare provider: Mikey Kirschner, PA-C   Cc. Left shoulder pain  Subjective    HPI  Ashley [name] is a 51 y/o female who presents today for left sided shoulder pain x 3 months with a worsening over the last few weeks.  She denies any injury. Reports L shoulder pain is sharp with certain movements, can radiate down her left arm and hand. At rest it can be a dull pain. She has stopped exercising d/t pain. She takes advil occasionally to relieve symptoms. Denies numbness, tingling.  Medications: Outpatient Medications Prior to Visit  Medication Sig   albuterol (VENTOLIN HFA) 108 (90 Base) MCG/ACT inhaler USE PRIOR TO EXERCISE AS DIRECTED   amLODipine (NORVASC) 5 MG tablet TAKE 1 TABLET BY MOUTH DAILY.   Blood Pressure Monitoring (OMRON 3 SERIES BP MONITOR) DEVI Use as directed   Drospirenone (SLYND) 4 MG TABS Take 1 tablet by mouth daily.   ergocalciferol (VITAMIN D2) 1.25 MG (50000 UT) capsule    influenza vac split quadrivalent PF (FLUARIX) 0.5 ML injection Inject into the muscle.   Insulin Pen Needle (UNIFINE PENTIPS) 31G X 5 MM MISC Use as directed with Saxenda   Liraglutide -Weight Management (SAXENDA) 18 MG/3ML SOPN Begin with 0.6 ml daily on Week 1. Increase to 1.2 ml daily on Week 2. Increase to 1.8 ml daily on Week 3. Increase to 2.4 ml daily on week 4. Increase to 3 ml daily on Week 5 and continue.,   norethindrone (MICRONOR) 0.35 MG tablet Take 1 tablet (0.35 mg total) by mouth daily.   traZODone (DESYREL) 50 MG tablet Take 0.5-1 tablets (25-50 mg total) by mouth at bedtime as needed for sleep.   triamterene-hydrochlorothiazide (MAXZIDE-25) 37.5-25 MG tablet TAKE 1 TABLET BY MOUTH DAILY.   azelastine (ASTELIN) 0.1 % nasal spray PLACE 1 SPRAY INTO BOTH NOSTRILS 2 TIMES DAILY. USE IN EACH NOSTRIL AS DIRECTED   No facility-administered  medications prior to visit.    Review of Systems  Constitutional:  Negative for fatigue and fever.  Respiratory:  Negative for cough and shortness of breath.   Cardiovascular:  Negative for chest pain and leg swelling.  Gastrointestinal:  Negative for abdominal pain.  Musculoskeletal:  Positive for arthralgias and myalgias.  Neurological:  Negative for dizziness and headaches.       Objective    Blood pressure 128/81, pulse 92, height 5' 4.5" (1.638 m), weight 182 lb 14.4 oz (83 kg), SpO2 98 %.   Physical Exam Vitals reviewed.  Constitutional:      Appearance: She is not ill-appearing.  HENT:     Head: Normocephalic.  Eyes:     Conjunctiva/sclera: Conjunctivae normal.  Cardiovascular:     Rate and Rhythm: Normal rate.  Pulmonary:     Effort: Pulmonary effort is normal. No respiratory distress.  Musculoskeletal:     Comments: L shoulder w/ anterior tenderness,  Limited extension, limited abduction + empty can test   Neurological:     General: No focal deficit present.     Mental Status: She is alert and oriented to person, place, and time.  Psychiatric:        Mood and Affect: Mood normal.        Behavior: Behavior normal.    No results found for any visits on 08/21/21.  Assessment & Plan  Problem List Items Addressed This Visit       Other   Acute pain of left shoulder - Primary    Suspicious for L rotator cuff impingement  Advised starting conservatively with ice/heat and mobic 7.5 mg 1-2 times a day  If no improvement recommending PT vs ortho      Relevant Medications   meloxicam (MOBIC) 7.5 MG tablet    Return if symptoms worsen or fail to improve.      I, Mikey Kirschner, PA-C have reviewed all documentation for this visit. The documentation on  08/21/2021 for the exam, diagnosis, procedures, and orders are all accurate and complete.  Mikey Kirschner, PA-C [facility] [address] [place], [place], [address] Office:  [phone] Fax: [facility]

## 2021-08-21 NOTE — Assessment & Plan Note (Signed)
Suspicious for L rotator cuff impingement  Advised starting conservatively with ice/heat and mobic 7.5 mg 1-2 times a day  If no improvement recommending PT vs ortho

## 2021-09-22 ENCOUNTER — Encounter (INDEPENDENT_AMBULATORY_CARE_PROVIDER_SITE_OTHER): Payer: No Typology Code available for payment source | Admitting: Physician Assistant

## 2021-09-22 DIAGNOSIS — M25512 Pain in left shoulder: Secondary | ICD-10-CM

## 2021-09-23 ENCOUNTER — Other Ambulatory Visit: Payer: Self-pay

## 2021-09-23 NOTE — Telephone Encounter (Signed)

## 2021-10-06 ENCOUNTER — Other Ambulatory Visit: Payer: Self-pay

## 2021-10-06 MED ORDER — METHYLPREDNISOLONE 4 MG PO TBPK
ORAL_TABLET | ORAL | 0 refills | Status: DC
  Filled 2021-10-06: qty 21, 6d supply, fill #0

## 2021-10-07 ENCOUNTER — Other Ambulatory Visit: Payer: Self-pay

## 2021-10-31 ENCOUNTER — Ambulatory Visit (INDEPENDENT_AMBULATORY_CARE_PROVIDER_SITE_OTHER): Payer: Self-pay | Admitting: Vascular Surgery

## 2021-11-12 ENCOUNTER — Ambulatory Visit: Payer: No Typology Code available for payment source | Admitting: Physical Therapy

## 2021-11-17 ENCOUNTER — Encounter: Payer: No Typology Code available for payment source | Admitting: Physical Therapy

## 2021-11-19 ENCOUNTER — Encounter: Payer: No Typology Code available for payment source | Admitting: Physical Therapy

## 2021-11-24 ENCOUNTER — Encounter: Payer: No Typology Code available for payment source | Admitting: Physical Therapy

## 2021-11-27 ENCOUNTER — Encounter: Payer: No Typology Code available for payment source | Admitting: Physical Therapy

## 2021-11-28 ENCOUNTER — Other Ambulatory Visit: Payer: Self-pay

## 2021-12-01 ENCOUNTER — Encounter: Payer: No Typology Code available for payment source | Admitting: Physical Therapy

## 2021-12-03 ENCOUNTER — Encounter: Payer: No Typology Code available for payment source | Admitting: Physical Therapy

## 2021-12-08 ENCOUNTER — Encounter: Payer: No Typology Code available for payment source | Admitting: Physical Therapy

## 2021-12-10 ENCOUNTER — Other Ambulatory Visit (INDEPENDENT_AMBULATORY_CARE_PROVIDER_SITE_OTHER): Payer: Self-pay | Admitting: Vascular Surgery

## 2021-12-10 DIAGNOSIS — I8310 Varicose veins of unspecified lower extremity with inflammation: Secondary | ICD-10-CM

## 2021-12-11 ENCOUNTER — Encounter: Payer: No Typology Code available for payment source | Admitting: Physical Therapy

## 2021-12-12 ENCOUNTER — Ambulatory Visit (INDEPENDENT_AMBULATORY_CARE_PROVIDER_SITE_OTHER): Payer: No Typology Code available for payment source

## 2021-12-12 ENCOUNTER — Ambulatory Visit (INDEPENDENT_AMBULATORY_CARE_PROVIDER_SITE_OTHER): Payer: No Typology Code available for payment source | Admitting: Vascular Surgery

## 2021-12-12 ENCOUNTER — Encounter (INDEPENDENT_AMBULATORY_CARE_PROVIDER_SITE_OTHER): Payer: Self-pay | Admitting: Vascular Surgery

## 2021-12-12 VITALS — BP 129/81 | HR 76 | Resp 18 | Ht 65.0 in | Wt 180.0 lb

## 2021-12-12 DIAGNOSIS — I10 Essential (primary) hypertension: Secondary | ICD-10-CM

## 2021-12-12 DIAGNOSIS — I8310 Varicose veins of unspecified lower extremity with inflammation: Secondary | ICD-10-CM

## 2021-12-12 DIAGNOSIS — E7800 Pure hypercholesterolemia, unspecified: Secondary | ICD-10-CM | POA: Diagnosis not present

## 2021-12-12 NOTE — Assessment & Plan Note (Signed)
Today, we did not see any evidence of venous reflux on duplex.  There was rouleaux flow on each side and a Baker's cyst on the right side as well.  Given this finding and her stable and somewhat vague symptoms, I do not think any further venous work-up is likely of any benefit.  I do not think she would benefit from laser ablation at this point.  She can return as needed.

## 2021-12-12 NOTE — Progress Notes (Signed)
MRN : [id number]  Ashley [name] is a 51 y.o. (July 04, 1970) female who presents with chief complaint of No chief complaint on file. Marland Kitchen  History of Present Illness: Patient returns today in follow up of venous disease.  2 years ago, we saw a reflux on a duplex study and had discussed laser ablation of both great saphenous veins.  Her symptoms were largely of numbness, tingling, and coldness in the feet and toes.  Today, we did not see any evidence of venous reflux on duplex.  There was rouleaux flow on each side and a Baker's cyst on the right side as well.  Current Outpatient Medications  Medication Sig Dispense Refill   albuterol (VENTOLIN HFA) 108 (90 Base) MCG/ACT inhaler USE PRIOR TO EXERCISE AS DIRECTED 18 g 0   amLODipine (NORVASC) 5 MG tablet TAKE 1 TABLET BY MOUTH DAILY. 90 tablet 3   Blood Pressure Monitoring (OMRON 3 SERIES BP MONITOR) DEVI Use as directed 1 each 0   Drospirenone (SLYND) 4 MG TABS Take 1 tablet by mouth daily. 84 tablet 3   Insulin Pen Needle (UNIFINE PENTIPS) 31G X 5 MM MISC Use as directed with Saxenda 100 each 0   triamterene-hydrochlorothiazide (MAXZIDE-25) 37.5-25 MG tablet TAKE 1 TABLET BY MOUTH DAILY. 90 tablet 3   azelastine (ASTELIN) 0.1 % nasal spray PLACE 1 SPRAY INTO BOTH NOSTRILS 2 TIMES DAILY. USE IN EACH NOSTRIL AS DIRECTED 30 mL 12   No current facility-administered medications for this visit.    Past Medical History:  Diagnosis Date   Brain hemangioma ([place])    [place] in 2018, never went to get head MRI   Hypertension    Migraine     Past Surgical History:  Procedure Laterality Date   BREAST BIOPSY Right 06/03/2018   X clip stereo bx CALCIUM OXALATE CRYSTALS WITHIN APOCRINE MICROCYSTS USUAL DUCTAL HYPERPLASIA, COLUMNAR CELL CHANGE, AND FOCAL PSEUDOANGIOMATOUS STROMAL HYPERPLASIA ([place]   BREAST CYST ASPIRATION Right 10+ yrs ago   CHOLECYSTECTOMY     COLONOSCOPY WITH PROPOFOL N/A 07/04/2021   Procedure: COLONOSCOPY WITH PROPOFOL;  Surgeon:  Lin Landsman, MD;  Location: [facility];  Service: Gastroenterology;  Laterality: N/A;   FOOT SURGERY       Social History   Tobacco Use   Smoking status: Never   Smokeless tobacco: Never  Vaping Use   Vaping Use: Never used  Substance Use Topics   Alcohol use: Yes    Alcohol/week: 0.0 standard drinks of alcohol    Comment: rarely   Drug use: Never       Family History  Problem Relation Age of Onset   Hypertension Mother    Hyperlipidemia Mother    Hypertension Father    Hyperlipidemia Father    Diabetes Mellitus I Sister    Diabetes Mellitus I Brother    Diabetes Mellitus II Paternal [employer] officer    Healthy Sister    Healthy Sister    Healthy Brother    Healthy Brother    Breast cancer Neg Hx    Colon cancer Neg Hx    Ovarian cancer Neg Hx      Allergies  Allergen Reactions   Ciprofloxacin Hives   Codeine Nausea And Vomiting   Gabapentin Nausea And Vomiting   Hydrocodone-Acetaminophen Nausea And Vomiting   Hydromorphone Hcl Nausea And Vomiting   Oxycodone-Acetaminophen Nausea And Vomiting   Promethazine Hcl Nausea And Vomiting   Tramadol Nausea And Vomiting   Sulfa Antibiotics Rash    hives hives  REVIEW OF SYSTEMS (Negative unless checked)  Constitutional: '[]'$ Weight loss  '[]'$ Fever  '[]'$ Chills Cardiac: '[]'$ Chest pain   '[]'$ Chest pressure   '[]'$ Palpitations   '[]'$ Shortness of breath when laying flat   '[]'$ Shortness of breath at rest   '[]'$ Shortness of breath with exertion. Vascular:  '[]'$ Pain in legs with walking   '[]'$ Pain in legs at rest   '[]'$ Pain in legs when laying flat   '[]'$ Claudication   '[]'$ Pain in feet when walking  '[]'$ Pain in feet at rest  '[]'$ Pain in feet when laying flat   '[]'$ History of DVT   '[]'$ Phlebitis   '[]'$ Swelling in legs   '[]'$ Varicose veins   '[]'$ Non-healing ulcers Pulmonary:   '[]'$ Uses home oxygen   '[]'$ Productive cough   '[]'$ Hemoptysis   '[]'$ Wheeze  '[]'$ COPD   '[]'$ Asthma Neurologic:  '[]'$ Dizziness  '[]'$ Blackouts   '[]'$ Seizures   '[]'$ History of stroke   '[]'$ History of TIA   '[]'$ Aphasia   '[]'$ Temporary blindness   '[]'$ Dysphagia   '[]'$ Weakness or numbness in arms   '[x]'$ Weakness or numbness in legs Musculoskeletal:  '[]'$ Arthritis   '[]'$ Joint swelling   '[]'$ Joint pain   '[]'$ Low back pain Hematologic:  '[]'$ Easy bruising  '[]'$ Easy bleeding   '[]'$ Hypercoagulable state   '[]'$ Anemic   Gastrointestinal:  '[]'$ Blood in stool   '[]'$ Vomiting blood  '[]'$ Gastroesophageal reflux/heartburn   '[]'$ Abdominal pain Genitourinary:  '[]'$ Chronic kidney disease   '[]'$ Difficult urination  '[]'$ Frequent urination  '[]'$ Burning with urination   '[]'$ Hematuria Skin:  '[]'$ Rashes   '[]'$ Ulcers   '[]'$ Wounds Psychological:  '[]'$ History of anxiety   '[]'$  History of major depression.  Physical Examination  BP 129/81 (BP Location: Right Arm)   Pulse 76   Resp 18   Ht '5\' 5"'$  (1.651 m)   Wt 180 lb (81.6 kg)   BMI 29.95 kg/m  Gen:  WD/WN, NAD Head: [place]/AT, No temporalis wasting. Ear/Nose/Throat: Hearing grossly intact, nares w/o erythema or drainage Eyes: Conjunctiva clear. Sclera non-icteric Neck: Supple.  Trachea midline Pulmonary:  Good air movement, no use of accessory muscles.  Cardiac: RRR, no JVD Vascular:  Vessel Right Left  Radial Palpable Palpable                          PT Palpable Palpable  DP Palpable Palpable   Gastrointestinal: soft, non-tender/non-distended. No guarding/reflex.  Musculoskeletal: M/S 5/5 throughout.  No deformity or atrophy.  No edema. Neurologic: Sensation grossly intact in extremities.  Symmetrical.  Speech is fluent.  Psychiatric: Judgment intact, Mood & affect appropriate for pt's clinical situation. Dermatologic: No rashes or ulcers noted.  No cellulitis or open wounds.      Labs No results found for this or any previous visit (from the past 2160 hour(s)).  Radiology No results found.  Assessment/Plan BP (high blood pressure) blood pressure control important in reducing the progression of atherosclerotic disease. On appropriate oral medications.     Hypercholesterolemia lipid control  important in reducing the progression of atherosclerotic disease.   Varicose veins with inflammation Today, we did not see any evidence of venous reflux on duplex.  There was rouleaux flow on each side and a Baker's cyst on the right side as well.  Given this finding and her stable and somewhat vague symptoms, I do not think any further venous work-up is likely of any benefit.  I do not think she would benefit from laser ablation at this point.  She can return as needed.    Leotis Pain, MD  12/12/2021 11:29 AM    This note was created  with Dragon medical transcription system.  Any errors from dictation are purely unintentional

## 2021-12-15 ENCOUNTER — Encounter: Payer: No Typology Code available for payment source | Admitting: Physical Therapy

## 2021-12-18 ENCOUNTER — Encounter: Payer: No Typology Code available for payment source | Admitting: Physical Therapy

## 2021-12-26 ENCOUNTER — Other Ambulatory Visit: Payer: Self-pay

## 2021-12-26 MED ORDER — METHYLPREDNISOLONE 4 MG PO TBPK
ORAL_TABLET | ORAL | 0 refills | Status: DC
  Filled 2021-12-26: qty 21, 6d supply, fill #0

## 2022-01-05 ENCOUNTER — Encounter (INDEPENDENT_AMBULATORY_CARE_PROVIDER_SITE_OTHER): Payer: Self-pay

## 2022-05-26 ENCOUNTER — Other Ambulatory Visit: Payer: Self-pay | Admitting: Physician Assistant

## 2022-05-26 DIAGNOSIS — Z1231 Encounter for screening mammogram for malignant neoplasm of breast: Secondary | ICD-10-CM

## 2022-06-05 ENCOUNTER — Encounter: Payer: No Typology Code available for payment source | Admitting: Physician Assistant

## 2022-06-10 NOTE — Progress Notes (Unsigned)
Complete physical exam   Patient: Ashley [name]   DOB: 11/27/1970   52 y.o. Female  MRN: [id number] Visit Date: 06/11/2022  Today's healthcare provider: Mikey Kirschner, PA-C   No chief complaint on file.  Subjective    Ashley [name] is a 52 y.o. female who presents today for a complete physical exam.  She reports consuming a {diet types:17450} diet. {Exercise:19826} She generally feels {well/fairly well/poorly:18703}. She reports sleeping {well/fairly well/poorly:18703}. She {does/does not:200015} have additional problems to discuss today.  HPI  ***  Past Medical History:  Diagnosis Date   Brain hemangioma ([place])    [place] in 2018, never went to get head MRI   Hypertension    Migraine    Past Surgical History:  Procedure Laterality Date   BREAST BIOPSY Right 06/03/2018   X clip stereo bx CALCIUM OXALATE CRYSTALS WITHIN APOCRINE MICROCYSTS USUAL DUCTAL HYPERPLASIA, COLUMNAR CELL CHANGE, AND FOCAL PSEUDOANGIOMATOUS STROMAL HYPERPLASIA ([place]   BREAST CYST ASPIRATION Right 10+ yrs ago   CHOLECYSTECTOMY     COLONOSCOPY WITH PROPOFOL N/A 07/04/2021   Procedure: COLONOSCOPY WITH PROPOFOL;  Surgeon: Lin Landsman, MD;  Location: [facility];  Service: Gastroenterology;  Laterality: N/A;   FOOT SURGERY     Social History   Socioeconomic History   Marital status: Single    Spouse name: Not on file   Number of children: Not on file   Years of education: Not on file   Highest education level: Not on file  Occupational History   Not on file  Tobacco Use   Smoking status: Never   Smokeless tobacco: Never  Vaping Use   Vaping Use: Never used  Substance and Sexual Activity   Alcohol use: Yes    Alcohol/week: 0.0 standard drinks of alcohol    Comment: rarely   Drug use: Never   Sexual activity: Not on file  Other Topics Concern   Not on file  Social History Narrative   Not on file   Social Determinants of Health   Financial Resource Strain: Not on file   Food Insecurity: Not on file  Transportation Needs: Not on file  Physical Activity: Not on file  Stress: Not on file  Social Connections: Not on file  Intimate Partner Violence: Not on file   Family Status  Relation Name Status   Mother  Alive   Father  Deceased   Sister  Deceased   Brother  Alive   MGM  Deceased   MGF  Deceased   PGM  Deceased   PGF  Deceased   Sister  Alive   Sister  Alive   Brother  Alive   Brother  Alive   Neg Hx  (Not Specified)   Family History  Problem Relation Age of Onset   Hypertension Mother    Hyperlipidemia Mother    Hypertension Father    Hyperlipidemia Father    Diabetes Mellitus I Sister    Diabetes Mellitus I Brother    Diabetes Mellitus II Paternal [employer]chant [employer] officer    Healthy Sister    Healthy Sister    Healthy Brother    Healthy Brother    Breast cancer Neg Hx    Colon cancer Neg Hx    Ovarian cancer Neg Hx    Allergies  Allergen Reactions   Ciprofloxacin Hives   Codeine Nausea And Vomiting   Gabapentin Nausea And Vomiting   Hydrocodone-Acetaminophen Nausea And Vomiting   Hydromorphone Hcl Nausea And Vomiting   Oxycodone-Acetaminophen Nausea And  Vomiting   Promethazine Hcl Nausea And Vomiting   Tramadol Nausea And Vomiting   Sulfa Antibiotics Rash    hives hives    Patient Care Team: Mikey Kirschner, PA-C as PCP - General (Physician Assistant)   Medications: Outpatient Medications Prior to Visit  Medication Sig   albuterol (VENTOLIN HFA) 108 (90 Base) MCG/ACT inhaler USE PRIOR TO EXERCISE AS DIRECTED   amLODipine (NORVASC) 5 MG tablet TAKE 1 TABLET BY MOUTH DAILY.   azelastine (ASTELIN) 0.1 % nasal spray PLACE 1 SPRAY INTO BOTH NOSTRILS 2 TIMES DAILY. USE IN EACH NOSTRIL AS DIRECTED   Blood Pressure Monitoring (OMRON 3 SERIES BP MONITOR) DEVI Use as directed   Drospirenone (SLYND) 4 MG TABS Take 1 tablet by mouth daily.   Insulin Pen Needle (UNIFINE PENTIPS) 31G X 5 MM MISC Use as directed with Saxenda    methylPREDNISolone (MEDROL) 4 MG TBPK tablet Take as directed per pack   triamterene-hydrochlorothiazide (MAXZIDE-25) 37.5-25 MG tablet TAKE 1 TABLET BY MOUTH DAILY.   No facility-administered medications prior to visit.    Review of Systems  {Labs  Heme  Chem  Endocrine  Serology  Results Review (optional):23779}  Objective    There were no vitals taken for this visit. {Show previous vital signs (optional):23777}   Physical Exam  ***  Last depression screening scores    08/21/2021    8:12 AM 06/27/2021    9:16 AM 02/07/2021   10:49 AM  PHQ 2/9 Scores  PHQ - 2 Score 0 0 2  PHQ- 9 Score 4  11   Last fall risk screening    08/21/2021    8:12 AM  [place] in the past year? 0  Number falls in past yr: 0  Injury with Fall? 0  Risk for fall due to : No Fall Risks   Last Audit-C alcohol use screening    08/21/2021    8:12 AM  Alcohol Use Disorder Test (AUDIT)  1. How often do you have a drink containing alcohol? 1  2. How many drinks containing alcohol do you have on a typical day when you are drinking? 0  3. How often do you have six or more drinks on one occasion? 0  AUDIT-C Score 1   A score of 3 or more in women, and 4 or more in men indicates increased risk for alcohol abuse, EXCEPT if all of the points are from question 1   No results found for any visits on 06/11/22.  Assessment & Plan    Routine Health Maintenance and Physical Exam  Exercise Activities and Dietary recommendations  Goals   None     Immunization History  Administered Date(s) Administered   Influenza,inj,Quad PF,6+ Mos 01/02/2021   Influenza-Unspecified 11/17/2016, 12/22/2017   PFIZER(Purple Top)SARS-COV-2 Vaccination 04/26/2019, 05/18/2019   Pfizer Covid-19 Vaccine Bivalent Booster 12yrs & up 01/19/2020, 12/26/2020   Tdap 06/19/2019    Health Maintenance  Topic Date Due   Zoster Vaccines- Shingrix (1 of 2) Never done   COVID-19 Vaccine (5 - 2023-24 season) 11/07/2021    PAP SMEAR-Modifier  12/27/2021   MAMMOGRAM  06/28/2022   INFLUENZA VACCINE  10/08/2022   COLONOSCOPY (Pts 45-18yrs Insurance coverage will need to be confirmed)  07/04/2024   DTaP/Tdap/Td (2 - Td or Tdap) 06/18/2029   Hepatitis C Screening  Completed   HIV Screening  Completed   HPV VACCINES  Aged Out   Fecal DNA (Cologuard)  Discontinued    Discussed health  benefits of physical activity, and encouraged her to engage in regular exercise appropriate for her age and condition.  ***  No follow-ups on file.     {provider attestation***:1}   Mikey Kirschner, PA-C  [facility] [phone] (phone) [phone] (fax)  [place]

## 2022-06-11 ENCOUNTER — Other Ambulatory Visit: Payer: Self-pay

## 2022-06-11 ENCOUNTER — Encounter: Payer: Self-pay | Admitting: Physician Assistant

## 2022-06-11 ENCOUNTER — Ambulatory Visit (INDEPENDENT_AMBULATORY_CARE_PROVIDER_SITE_OTHER): Payer: 59 | Admitting: Physician Assistant

## 2022-06-11 VITALS — BP 128/90 | HR 102 | Temp 99.1°F | Wt 181.8 lb

## 2022-06-11 DIAGNOSIS — R051 Acute cough: Secondary | ICD-10-CM | POA: Diagnosis not present

## 2022-06-11 DIAGNOSIS — R509 Fever, unspecified: Secondary | ICD-10-CM | POA: Diagnosis not present

## 2022-06-11 DIAGNOSIS — B309 Viral conjunctivitis, unspecified: Secondary | ICD-10-CM | POA: Diagnosis not present

## 2022-06-11 DIAGNOSIS — U071 COVID-19: Secondary | ICD-10-CM

## 2022-06-11 DIAGNOSIS — E782 Mixed hyperlipidemia: Secondary | ICD-10-CM

## 2022-06-11 DIAGNOSIS — I10 Essential (primary) hypertension: Secondary | ICD-10-CM

## 2022-06-11 DIAGNOSIS — R7303 Prediabetes: Secondary | ICD-10-CM

## 2022-06-11 LAB — POCT INFLUENZA A/B
Influenza A, POC: NEGATIVE
Influenza B, POC: NEGATIVE

## 2022-06-11 LAB — POC COVID19 BINAXNOW: SARS Coronavirus 2 Ag: POSITIVE — AB

## 2022-06-11 MED ORDER — POLYMYXIN B-TRIMETHOPRIM 10000-0.1 UNIT/ML-% OP SOLN
1.0000 [drp] | OPHTHALMIC | 0 refills | Status: DC
  Filled 2022-06-11: qty 10, 15d supply, fill #0

## 2022-06-19 DIAGNOSIS — H5213 Myopia, bilateral: Secondary | ICD-10-CM | POA: Diagnosis not present

## 2022-06-19 DIAGNOSIS — E782 Mixed hyperlipidemia: Secondary | ICD-10-CM | POA: Diagnosis not present

## 2022-06-19 DIAGNOSIS — I10 Essential (primary) hypertension: Secondary | ICD-10-CM | POA: Diagnosis not present

## 2022-06-19 DIAGNOSIS — R7303 Prediabetes: Secondary | ICD-10-CM | POA: Diagnosis not present

## 2022-06-19 DIAGNOSIS — H524 Presbyopia: Secondary | ICD-10-CM | POA: Diagnosis not present

## 2022-06-20 LAB — CBC WITH DIFFERENTIAL/PLATELET
Basophils Absolute: 0 10*3/uL (ref 0.0–0.2)
Basos: 0 %
EOS (ABSOLUTE): 0.4 10*3/uL (ref 0.0–0.4)
Eos: 4 %
Hematocrit: 42.2 % (ref 34.0–46.6)
Hemoglobin: 14.2 g/dL (ref 11.1–15.9)
Immature Grans (Abs): 0 10*3/uL (ref 0.0–0.1)
Immature Granulocytes: 0 %
Lymphocytes Absolute: 4 10*3/uL — ABNORMAL HIGH (ref 0.7–3.1)
Lymphs: 39 %
MCH: 29.2 pg (ref 26.6–33.0)
MCHC: 33.6 g/dL (ref 31.5–35.7)
MCV: 87 fL (ref 79–97)
Monocytes Absolute: 0.8 10*3/uL (ref 0.1–0.9)
Monocytes: 8 %
Neutrophils Absolute: 5.2 10*3/uL (ref 1.4–7.0)
Neutrophils: 49 %
Platelets: 383 10*3/uL (ref 150–450)
RBC: 4.86 x10E6/uL (ref 3.77–5.28)
RDW: 11.8 % (ref 11.7–15.4)
WBC: 10.5 10*3/uL (ref 3.4–10.8)

## 2022-06-20 LAB — COMPREHENSIVE METABOLIC PANEL
ALT: 39 IU/L — ABNORMAL HIGH (ref 0–32)
AST: 18 IU/L (ref 0–40)
Albumin/Globulin Ratio: 1.3 (ref 1.2–2.2)
Albumin: 3.9 g/dL (ref 3.8–4.9)
Alkaline Phosphatase: 110 IU/L (ref 44–121)
BUN/Creatinine Ratio: 11 (ref 9–23)
BUN: 10 mg/dL (ref 6–24)
Bilirubin Total: 0.4 mg/dL (ref 0.0–1.2)
CO2: 23 mmol/L (ref 20–29)
Calcium: 9.5 mg/dL (ref 8.7–10.2)
Chloride: 106 mmol/L (ref 96–106)
Creatinine, Ser: 0.93 mg/dL (ref 0.57–1.00)
Globulin, Total: 3.1 g/dL (ref 1.5–4.5)
Glucose: 108 mg/dL — ABNORMAL HIGH (ref 70–99)
Potassium: 4.4 mmol/L (ref 3.5–5.2)
Sodium: 144 mmol/L (ref 134–144)
Total Protein: 7 g/dL (ref 6.0–8.5)
eGFR: 73 mL/min/{1.73_m2} (ref >59)

## 2022-06-20 LAB — HEMOGLOBIN A1C
Est. average glucose Bld gHb Est-mCnc: 134 mg/dL
Hgb A1c MFr Bld: 6.3 % — ABNORMAL HIGH (ref 4.8–5.6)

## 2022-06-20 LAB — LIPID PANEL
Chol/HDL Ratio: 4.4 ratio (ref 0.0–4.4)
Cholesterol, Total: 208 mg/dL — ABNORMAL HIGH (ref 100–199)
HDL: 47 mg/dL (ref >39)
LDL Chol Calc (NIH): 139 mg/dL — ABNORMAL HIGH (ref 0–99)
Triglycerides: 124 mg/dL (ref 0–149)
VLDL Cholesterol Cal: 22 mg/dL (ref 5–40)

## 2022-06-22 ENCOUNTER — Other Ambulatory Visit: Payer: Self-pay

## 2022-06-22 ENCOUNTER — Other Ambulatory Visit: Payer: Self-pay | Admitting: Physician Assistant

## 2022-06-22 DIAGNOSIS — I10 Essential (primary) hypertension: Secondary | ICD-10-CM

## 2022-06-22 MED ORDER — TRIAMTERENE-HCTZ 37.5-25 MG PO TABS
1.0000 | ORAL_TABLET | Freq: Every day | ORAL | 3 refills | Status: DC
  Filled 2022-06-22: qty 90, 90d supply, fill #0
  Filled 2022-09-14 – 2022-09-15 (×2): qty 90, 90d supply, fill #1
  Filled 2022-12-30: qty 90, 90d supply, fill #2
  Filled 2023-03-30: qty 90, 90d supply, fill #3

## 2022-06-22 MED ORDER — AMLODIPINE BESYLATE 5 MG PO TABS
5.0000 mg | ORAL_TABLET | Freq: Every day | ORAL | 3 refills | Status: DC
  Filled 2022-06-22: qty 90, 90d supply, fill #0
  Filled 2022-09-14 – 2022-09-15 (×2): qty 90, 90d supply, fill #1
  Filled 2022-12-30: qty 90, 90d supply, fill #2
  Filled 2023-03-30: qty 90, 90d supply, fill #3

## 2022-06-25 NOTE — Progress Notes (Deleted)
Complete physical exam   Patient: Ashley [name]   DOB: Jun 07, 1970   52 y.o. Female  MRN: [id number] Visit Date: 06/26/2022  Today's healthcare provider: [name], PA-C   No chief complaint on file.  Subjective    Ashley [name] is a 52 y.o. female who presents today for a complete physical exam.  She reports consuming a {diet types:17450} diet. {Exercise:19826} She generally feels {well/fairly well/poorly:18703}. She reports sleeping {well/fairly well/poorly:18703}. She {does/does not:200015} have additional problems to discuss today.  HPI  ***  Past Medical History:  Diagnosis Date   Brain hemangioma    Benig in 2018, never went to get head MRI   Hypertension    Migraine    Past Surgical History:  Procedure Laterality Date   BREAST BIOPSY Right 06/03/2018   X clip stereo bx CALCIUM OXALATE CRYSTALS WITHIN APOCRINE MICROCYSTS USUAL DUCTAL HYPERPLASIA, COLUMNAR CELL CHANGE, AND FOCAL PSEUDOANGIOMATOUS STROMAL HYPERPLASIA (PASH   BREAST CYST ASPIRATION Right 10+ yrs ago   CHOLECYSTECTOMY     COLONOSCOPY WITH PROPOFOL N/A 07/04/2021   Procedure: COLONOSCOPY WITH PROPOFOL;  Surgeon: Toney Reil, MD;  Location: [facility];  Service: Gastroenterology;  Laterality: N/A;   FOOT SURGERY     Social History   Socioeconomic History   Marital status: Single    Spouse name: Not on file   Number of children: Not on file   Years of education: Not on file   Highest education level: Not on file  Occupational History   Not on file  Tobacco Use   Smoking status: Never   Smokeless tobacco: Never  Vaping Use   Vaping Use: Never used  Substance and Sexual Activity   Alcohol use: Yes    Alcohol/week: 0.0 standard drinks of alcohol    Comment: rarely   Drug use: Never   Sexual activity: Not on file  Other Topics Concern   Not on file  Social History Narrative   Not on file   Social Determinants of Health   Financial Resource Strain: Not on file  Food  Insecurity: Not on file  Transportation Needs: Not on file  Physical Activity: Not on file  Stress: Not on file  Social Connections: Not on file  Intimate Partner Violence: Not on file   Family Status  Relation Name Status   Mother  Alive   Father  Deceased   Sister  Deceased   Brother  Alive   MGM  Deceased   MGF  Deceased   PGM  Deceased   PGF  Deceased   Sister  Alive   Sister  Alive   Brother  Alive   Brother  Alive   Neg Hx  (Not Specified)   Family History  Problem Relation Age of Onset   Hypertension Mother    Hyperlipidemia Mother    Hypertension Father    Hyperlipidemia Father    Diabetes Mellitus I Sister    Diabetes Mellitus I Brother    Diabetes Mellitus II Paternal Actor    Healthy Sister    Healthy Sister    Healthy Brother    Healthy Brother    Breast cancer Neg Hx    Colon cancer Neg Hx    Ovarian cancer Neg Hx    Allergies  Allergen Reactions   Ciprofloxacin Hives   Codeine Nausea And Vomiting   Gabapentin Nausea And Vomiting   Hydrocodone-Acetaminophen Nausea And Vomiting   Hydromorphone Hcl Nausea And Vomiting   Oxycodone-Acetaminophen Nausea And Vomiting  Promethazine Hcl Nausea And Vomiting   Tramadol Nausea And Vomiting   Sulfa Antibiotics Rash    hives hives    Patient Care Team: [name], PA-C as PCP - General (Physician Assistant)   Medications: Outpatient Medications Prior to Visit  Medication Sig   albuterol (VENTOLIN HFA) 108 (90 Base) MCG/ACT inhaler USE PRIOR TO EXERCISE AS DIRECTED   amLODipine (NORVASC) 5 MG tablet Take 1 tablet (5 mg total) by mouth daily.   azelastine (ASTELIN) 0.1 % nasal spray PLACE 1 SPRAY INTO BOTH NOSTRILS 2 TIMES DAILY. USE IN EACH NOSTRIL AS DIRECTED   Blood Pressure Monitoring (OMRON 3 SERIES BP MONITOR) DEVI Use as directed   Drospirenone (SLYND) 4 MG TABS Take 1 tablet by mouth daily.   Insulin Pen Needle (UNIFINE PENTIPS) 31G X 5 MM MISC Use as directed with Saxenda    triamterene-hydrochlorothiazide (MAXZIDE-25) 37.5-25 MG tablet TAKE 1 TABLET BY MOUTH DAILY.   trimethoprim-polymyxin b (POLYTRIM) ophthalmic solution Place 1 drop into the left eye every 4 (four) hours.   No facility-administered medications prior to visit.    Review of Systems  {Labs  Heme  Chem  Endocrine  Serology  Results Review (optional):23779}  Objective    There were no vitals taken for this visit. {Show previous vital signs (optional):23777}   Physical Exam  ***  Last depression screening scores    06/11/2022    3:42 PM 08/21/2021    8:12 AM 06/27/2021    9:16 AM  PHQ 2/9 Scores  PHQ - 2 Score 0 0 0  PHQ- 9 Score 2 4    Last fall risk screening    08/21/2021    8:12 AM  Fall Risk   Falls in the past year? 0  Number falls in past yr: 0  Injury with Fall? 0  Risk for fall due to : No Fall Risks   Last Audit-C alcohol use screening    06/11/2022    3:42 PM  Alcohol Use Disorder Test (AUDIT)  1. How often do you have a drink containing alcohol? 0  2. How many drinks containing alcohol do you have on a typical day when you are drinking? 0  3. How often do you have six or more drinks on one occasion? 0  AUDIT-C Score 0   A score of 3 or more in women, and 4 or more in men indicates increased risk for alcohol abuse, EXCEPT if all of the points are from question 1   No results found for any visits on 06/26/22.  Assessment & Plan    Routine Health Maintenance and Physical Exam  Exercise Activities and Dietary recommendations  Goals   None     Immunization History  Administered Date(s) Administered   Influenza,inj,Quad PF,6+ Mos 01/02/2021   Influenza-Unspecified 11/17/2016, 12/22/2017   PFIZER(Purple Top)SARS-COV-2 Vaccination 04/26/2019, 05/18/2019   Pfizer Covid-19 Vaccine Bivalent Booster 47yrs & up 01/19/2020, 12/26/2020   Tdap 06/19/2019    Health Maintenance  Topic Date Due   Zoster Vaccines- Shingrix (1 of 2) Never done   COVID-19 Vaccine  (5 - 2023-24 season) 11/07/2021   MAMMOGRAM  06/28/2022   INFLUENZA VACCINE  10/08/2022   PAP SMEAR-Modifier  12/28/2023   COLONOSCOPY (Pts 45-30yrs Insurance coverage will need to be confirmed)  07/04/2024   DTaP/Tdap/Td (2 - Td or Tdap) 06/18/2029   Hepatitis C Screening  Completed   HIV Screening  Completed   HPV VACCINES  Aged Out   Fecal DNA (Cologuard)  Discontinued    Discussed health benefits of physical activity, and encouraged her to engage in regular exercise appropriate for her age and condition.  ***  No follow-ups on file.     {provider attestation***:1}   [name], PA-C  [facility] [phone] (phone) [phone] (fax)  [facility]

## 2022-06-26 ENCOUNTER — Encounter: Payer: 59 | Admitting: Physician Assistant

## 2022-07-03 ENCOUNTER — Ambulatory Visit
Admission: RE | Admit: 2022-07-03 | Discharge: 2022-07-03 | Disposition: A | Discharge status: Home / Self Care | Payer: 59 | Source: Ambulatory Visit | Attending: Physician Assistant | Admitting: Physician Assistant

## 2022-07-03 DIAGNOSIS — Z1231 Encounter for screening mammogram for malignant neoplasm of breast: Secondary | ICD-10-CM | POA: Insufficient documentation

## 2022-07-10 ENCOUNTER — Other Ambulatory Visit: Payer: Self-pay

## 2022-07-10 ENCOUNTER — Encounter: Payer: Self-pay | Admitting: Physician Assistant

## 2022-07-10 ENCOUNTER — Ambulatory Visit (INDEPENDENT_AMBULATORY_CARE_PROVIDER_SITE_OTHER): Payer: 59 | Admitting: Physician Assistant

## 2022-07-10 VITALS — BP 116/71 | HR 75 | Ht 64.0 in | Wt 181.9 lb

## 2022-07-10 DIAGNOSIS — R7303 Prediabetes: Secondary | ICD-10-CM | POA: Diagnosis not present

## 2022-07-10 DIAGNOSIS — F5101 Primary insomnia: Secondary | ICD-10-CM | POA: Diagnosis not present

## 2022-07-10 DIAGNOSIS — I10 Essential (primary) hypertension: Secondary | ICD-10-CM | POA: Diagnosis not present

## 2022-07-10 DIAGNOSIS — Z0000 Encounter for general adult medical examination without abnormal findings: Secondary | ICD-10-CM

## 2022-07-10 DIAGNOSIS — E782 Mixed hyperlipidemia: Secondary | ICD-10-CM

## 2022-07-10 MED ORDER — HYDROXYZINE PAMOATE 50 MG PO CAPS
50.0000 mg | ORAL_CAPSULE | Freq: Every evening | ORAL | 0 refills | Status: DC | PRN
Start: 2022-07-10 — End: 2022-09-15
  Filled 2022-07-10 (×2): qty 30, 30d supply, fill #0

## 2022-07-10 MED ORDER — SLYND 4 MG PO TABS
1.0000 | ORAL_TABLET | Freq: Every day | ORAL | 3 refills | Status: DC
  Filled 2022-07-10 (×2): qty 84, 84d supply, fill #0
  Filled 2022-09-29: qty 84, 84d supply, fill #1
  Filled 2023-01-14: qty 84, 84d supply, fill #2
  Filled 2023-03-30: qty 84, 84d supply, fill #3

## 2022-07-10 NOTE — Assessment & Plan Note (Signed)
Moderately increased The 10-year ASCVD risk score (Arnett DK, et al., 2019) is: 3.7% Diet/exercise recommendations

## 2022-07-10 NOTE — Assessment & Plan Note (Signed)
H/o of trazodone failure Trial of hydroxyzine  Sleep hygiene

## 2022-07-10 NOTE — Assessment & Plan Note (Signed)
A1c 6.3% discussed on pre-DM, DM, diet, exercise Metformin as a med option F/u 6 mo a1c

## 2022-07-10 NOTE — Assessment & Plan Note (Signed)
Managed on amlodipine 5 mg and maxide.  In excellent range today . F/u 6 mo

## 2022-07-10 NOTE — Progress Notes (Signed)
I,[name],acting as a Neurosurgeon for [employer], PA-C.,have documented all relevant documentation on the behalf of Ashley Ferguson, PA-C,as directed by  Ashley Ferguson, PA-C while in the presence of Ashley Ferguson, PA-C.   Complete physical exam   Patient: Ashley [name]   DOB: 07-11-1970   52 y.o. Female  MRN: [id number] Visit Date: 07/10/2022  Today's healthcare provider: [name], PA-C   Cc. cpe  Subjective    Ashley [name] is a 52 y.o. female who presents today for a complete physical exam.  She reports consuming a general diet. The patient does not participate in regular exercise at present. She generally feels well. She reports sleeping well but has a hard time staying asleep once she is sleeping. She does not have additional problems to discuss today.  HPI  -Shingles Vaccine: declined  Past Medical History:  Diagnosis Date   Brain hemangioma (HCC)    Benig in 2018, never went to get head MRI   Hypertension    Migraine    Past Surgical History:  Procedure Laterality Date   BREAST BIOPSY Right 06/03/2018   X clip stereo bx CALCIUM OXALATE CRYSTALS WITHIN APOCRINE MICROCYSTS USUAL DUCTAL HYPERPLASIA, COLUMNAR CELL CHANGE, AND FOCAL PSEUDOANGIOMATOUS STROMAL HYPERPLASIA (PASH   BREAST CYST ASPIRATION Right 10+ yrs ago   CHOLECYSTECTOMY     COLONOSCOPY WITH PROPOFOL N/A 07/04/2021   Procedure: COLONOSCOPY WITH PROPOFOL;  Surgeon: Toney Reil, MD;  Location: [facility];  Service: Gastroenterology;  Laterality: N/A;   FOOT SURGERY     Social History   Socioeconomic History   Marital status: Single    Spouse name: Not on file   Number of children: Not on file   Years of education: Not on file   Highest education level: Not on file  Occupational History   Not on file  Tobacco Use   Smoking status: Never   Smokeless tobacco: Never  Vaping Use   Vaping Use: Never used  Substance and Sexual Activity   Alcohol use: Yes    Alcohol/week: 0.0  standard drinks of alcohol    Comment: rarely   Drug use: Never   Sexual activity: Not on file  Other Topics Concern   Not on file  Social History Narrative   Not on file   Social Determinants of Health   Financial Resource Strain: Not on file  Food Insecurity: Not on file  Transportation Needs: Not on file  Physical Activity: Not on file  Stress: Not on file  Social Connections: Not on file  Intimate Partner Violence: Not on file   Family Status  Relation Name Status   Mother  Alive   Father  Deceased   Sister  Deceased   Brother  Alive   MGM  Deceased   MGF  Deceased   PGM  Deceased   PGF  Deceased   Sister  Alive   Sister  Alive   Brother  Alive   Brother  Alive   Neg Hx  (Not Specified)   Family History  Problem Relation Age of Onset   Hypertension Mother    Hyperlipidemia Mother    Hypertension Father    Hyperlipidemia Father    Diabetes Mellitus I Sister    Diabetes Mellitus I Brother    Diabetes Mellitus II Paternal Actor    Healthy Sister    Healthy Sister    Healthy Brother    Healthy Brother    Breast cancer Neg Hx    Colon  cancer Neg Hx    Ovarian cancer Neg Hx    Allergies  Allergen Reactions   Ciprofloxacin Hives   Codeine Nausea And Vomiting   Gabapentin Nausea And Vomiting   Hydrocodone-Acetaminophen Nausea And Vomiting   Hydromorphone Hcl Nausea And Vomiting   Oxycodone-Acetaminophen Nausea And Vomiting   Promethazine Hcl Nausea And Vomiting   Tramadol Nausea And Vomiting   Sulfa Antibiotics Rash    hives hives    Patient Care Team: Ashley Ferguson, PA-C as PCP - General (Physician Assistant)   Medications: Outpatient Medications Prior to Visit  Medication Sig   amLODipine (NORVASC) 5 MG tablet Take 1 tablet (5 mg total) by mouth daily.   Blood Pressure Monitoring (OMRON 3 SERIES BP MONITOR) DEVI Use as directed   Insulin Pen Needle (UNIFINE PENTIPS) 31G X 5 MM MISC Use as directed with Saxenda    triamterene-hydrochlorothiazide (MAXZIDE-25) 37.5-25 MG tablet TAKE 1 TABLET BY MOUTH DAILY.   trimethoprim-polymyxin b (POLYTRIM) ophthalmic solution Place 1 drop into the left eye every 4 (four) hours.   [DISCONTINUED] Drospirenone (SLYND) 4 MG TABS Take 1 tablet by mouth daily.   albuterol (VENTOLIN HFA) 108 (90 Base) MCG/ACT inhaler USE PRIOR TO EXERCISE AS DIRECTED   azelastine (ASTELIN) 0.1 % nasal spray PLACE 1 SPRAY INTO BOTH NOSTRILS 2 TIMES DAILY. USE IN EACH NOSTRIL AS DIRECTED   No facility-administered medications prior to visit.    Review of Systems  Constitutional:  Negative for fatigue and fever.  Respiratory:  Negative for cough and shortness of breath.   Cardiovascular:  Negative for chest pain and leg swelling.  Gastrointestinal:  Negative for abdominal pain.  Neurological:  Negative for dizziness and headaches.      Objective    BP 116/71 (BP Location: Right Arm, Patient Position: Sitting, Cuff Size: Normal)   Pulse 75   Ht 5\' 4"  (1.626 m)   Wt 181 lb 14.4 oz (82.5 kg)   SpO2 100%   BMI 31.22 kg/m   Physical Exam Constitutional:      General: She is awake.     Appearance: She is well-developed. She is not ill-appearing.  HENT:     Head: Normocephalic.     Right Ear: Tympanic membrane normal.     Left Ear: Tympanic membrane normal.     Nose: Nose normal. No congestion or rhinorrhea.     Mouth/Throat:     Pharynx: No oropharyngeal exudate or posterior oropharyngeal erythema.  Eyes:     Conjunctiva/sclera: Conjunctivae normal.     Pupils: Pupils are equal, round, and reactive to light.  Neck:     Thyroid: No thyroid mass or thyromegaly.  Cardiovascular:     Rate and Rhythm: Normal rate and regular rhythm.     Heart sounds: Normal heart sounds.  Pulmonary:     Effort: Pulmonary effort is normal.     Breath sounds: Normal breath sounds.  Abdominal:     Palpations: Abdomen is soft.     Tenderness: There is no abdominal tenderness.  Musculoskeletal:      Right lower leg: No swelling. No edema.     Left lower leg: No swelling. No edema.  Lymphadenopathy:     Cervical: No cervical adenopathy.  Skin:    General: Skin is warm.  Neurological:     Mental Status: She is alert and oriented to person, place, and time.  Psychiatric:        Attention and Perception: Attention normal.  Mood and Affect: Mood normal.        Speech: Speech normal.        Behavior: Behavior normal. Behavior is cooperative.      Last depression screening scores    07/10/2022    1:15 PM 06/11/2022    3:42 PM 08/21/2021    8:12 AM  PHQ 2/9 Scores  PHQ - 2 Score 0 0 0  PHQ- 9 Score 2 2 4    Last fall risk screening    08/21/2021    8:12 AM  Fall Risk   Falls in the past year? 0  Number falls in past yr: 0  Injury with Fall? 0  Risk for fall due to : No Fall Risks   Last Audit-C alcohol use screening    06/11/2022    3:42 PM  Alcohol Use Disorder Test (AUDIT)  1. How often do you have a drink containing alcohol? 0  2. How many drinks containing alcohol do you have on a typical day when you are drinking? 0  3. How often do you have six or more drinks on one occasion? 0  AUDIT-C Score 0   A score of 3 or more in women, and 4 or more in men indicates increased risk for alcohol abuse, EXCEPT if all of the points are from question 1   No results found for any visits on 07/10/22.  Assessment & Plan    Routine Health Maintenance and Physical Exam  Exercise Activities and Dietary recommendations --balanced diet high in fiber and protein, low in sugars, carbs, fats. --physical activity/exercise 30 minutes 3-5 times a week     Immunization History  Administered Date(s) Administered   Influenza,inj,Quad PF,6+ Mos 01/02/2021   Influenza-Unspecified 11/17/2016, 12/22/2017   PFIZER(Purple Top)SARS-COV-2 Vaccination 04/26/2019, 05/18/2019   Pfizer Covid-19 Vaccine Bivalent Booster 39yrs & up 01/19/2020, 12/26/2020   Tdap 06/19/2019    Health  Maintenance  Topic Date Due   Zoster Vaccines- Shingrix (1 of 2) Never done   COVID-19 Vaccine (5 - 2023-24 season) 11/07/2021   INFLUENZA VACCINE  10/08/2022   MAMMOGRAM  07/03/2023   PAP SMEAR-Modifier  12/28/2023   COLONOSCOPY (Pts 45-39yrs Insurance coverage will need to be confirmed)  07/04/2024   DTaP/Tdap/Td (2 - Td or Tdap) 06/18/2029   Hepatitis C Screening  Completed   HIV Screening  Completed   HPV VACCINES  Aged Out   Fecal DNA (Cologuard)  Discontinued    Discussed health benefits of physical activity, and encouraged her to engage in regular exercise appropriate for her age and condition. Problem List Items Addressed This Visit       Cardiovascular and Mediastinum   Essential hypertension    Managed on amlodipine 5 mg and maxide.  In excellent range today . F/u 6 mo         Other   Prediabetes    A1c 6.3% discussed on pre-DM, DM, diet, exercise Metformin as a med option F/u 6 mo a1c      Moderate mixed hyperlipidemia not requiring statin therapy    Moderately increased The 10-year ASCVD risk score (Arnett DK, et al., 2019) is: 3.7% Diet/exercise recommendations      Primary insomnia    H/o of trazodone failure Trial of hydroxyzine  Sleep hygiene       Relevant Medications   hydrOXYzine (VISTARIL) 50 MG capsule   Other Visit Diagnoses     Annual physical exam    -  Primary  Return in about 6 months (around 01/10/2023) for pre-dm, htn.     I, Ashley Ferguson, PA-C have reviewed all documentation for this visit. The documentation on  07/10/22   for the exam, diagnosis, procedures, and orders are all accurate and complete.  Ashley Ferguson, PA-C [facility] [address] [place], [place], [address] Office: [phone] Fax: [phone]   [facility]

## 2022-09-14 ENCOUNTER — Other Ambulatory Visit: Payer: Self-pay | Admitting: Physician Assistant

## 2022-09-14 ENCOUNTER — Other Ambulatory Visit: Payer: Self-pay

## 2022-09-14 DIAGNOSIS — F5101 Primary insomnia: Secondary | ICD-10-CM

## 2022-09-15 ENCOUNTER — Other Ambulatory Visit: Payer: Self-pay

## 2022-09-15 MED FILL — Hydroxyzine Pamoate Cap 50 MG: ORAL | 30 days supply | Qty: 30 | Fill #0 | Status: CN

## 2022-09-15 MED FILL — Hydroxyzine Pamoate Cap 50 MG: ORAL | 30 days supply | Qty: 30 | Fill #0 | Status: AC

## 2022-09-17 ENCOUNTER — Other Ambulatory Visit: Payer: Self-pay

## 2022-09-29 ENCOUNTER — Other Ambulatory Visit: Payer: Self-pay

## 2023-01-14 ENCOUNTER — Other Ambulatory Visit: Payer: Self-pay

## 2023-01-14 DIAGNOSIS — M76821 Posterior tibial tendinitis, right leg: Secondary | ICD-10-CM | POA: Diagnosis not present

## 2023-01-14 DIAGNOSIS — M7541 Impingement syndrome of right shoulder: Secondary | ICD-10-CM | POA: Diagnosis not present

## 2023-01-14 MED ORDER — METHYLPREDNISOLONE 4 MG PO TBPK
ORAL_TABLET | ORAL | 0 refills | Status: DC
  Filled 2023-01-14: qty 21, 6d supply, fill #0

## 2023-01-22 ENCOUNTER — Ambulatory Visit: Payer: 59 | Admitting: Family Medicine

## 2023-01-29 ENCOUNTER — Ambulatory Visit: Payer: 59 | Admitting: Physician Assistant

## 2023-02-26 ENCOUNTER — Other Ambulatory Visit: Payer: Self-pay

## 2023-02-26 DIAGNOSIS — M25571 Pain in right ankle and joints of right foot: Secondary | ICD-10-CM | POA: Diagnosis not present

## 2023-02-26 MED ORDER — METHYLPREDNISOLONE 4 MG PO TBPK
ORAL_TABLET | ORAL | 0 refills | Status: DC
  Filled 2023-02-26: qty 21, 6d supply, fill #0

## 2023-03-12 ENCOUNTER — Other Ambulatory Visit: Payer: Self-pay | Admitting: Orthopedic Surgery

## 2023-03-12 DIAGNOSIS — M25571 Pain in right ankle and joints of right foot: Secondary | ICD-10-CM

## 2023-03-19 ENCOUNTER — Ambulatory Visit
Admission: RE | Admit: 2023-03-19 | Discharge: 2023-03-19 | Disposition: A | Discharge status: Home / Self Care | Payer: 59 | Source: Ambulatory Visit | Attending: Orthopedic Surgery | Admitting: Orthopedic Surgery

## 2023-03-19 DIAGNOSIS — M25571 Pain in right ankle and joints of right foot: Secondary | ICD-10-CM

## 2023-03-30 ENCOUNTER — Other Ambulatory Visit: Payer: Self-pay

## 2023-04-02 ENCOUNTER — Other Ambulatory Visit: Payer: Self-pay

## 2023-04-02 DIAGNOSIS — M76821 Posterior tibial tendinitis, right leg: Secondary | ICD-10-CM | POA: Diagnosis not present

## 2023-04-02 MED ORDER — MELOXICAM 7.5 MG PO TABS
7.5000 mg | ORAL_TABLET | Freq: Two times a day (BID) | ORAL | 1 refills | Status: DC
  Filled 2023-04-02: qty 60, 30d supply, fill #0
  Filled 2023-05-20: qty 60, 30d supply, fill #1

## 2023-05-21 DIAGNOSIS — F411 Generalized anxiety disorder: Secondary | ICD-10-CM | POA: Diagnosis not present

## 2023-05-25 NOTE — Progress Notes (Unsigned)
 GYNECOLOGY: ANNUAL EXAM   Subjective:    PCP: Ronnald Ramp, MD Ashley [name] is a 53 y.o. female No obstetric history on file. who presents for annual wellness visit.   Well Woman Visit:  GYN HISTORY:  No LMP recorded. (Menstrual status: Oral contraceptives).     Menstrual History: OB History   No obstetric history on file.     Menarche age: *** No LMP recorded. (Menstrual status: Oral contraceptives).     Periods are every *** days, and last *** days, flow is light / moderate / heavy.  Uses pads / tampons / menstrual cup and changes it every *** hours.  Cramping is mild / moderate / severe.  Cyclic symptoms include: {symptoms; gyn cyclic:13153}.  Intermenstrual bleeding, spotting, or discharge? *** Urinary incontinence? ***  Sexually active: *** Number of sexual partners: *** Gender of sexual Partners: *** Social History   Substance and Sexual Activity  Sexual Activity Not on file   Contraceptive methods: {PLAN CONTRACEPTION:313102} Dyspareunia? *** STI history: *** STI/HIV testing or immunizations needed? {yes NW:295621}   Health Maintenance: -Last pap: approximate date 12/28/18 and was normal NILM HPV NEG --> Any abnormals: *** -Last mammogram: 07/03/22 --> Any abnormals? *** -Last colon cancer screen: 07/04/21 / Type: COLONOSCOPY -Last DEXA scan: NO -[facility] of Breast / Colon / Cervical cancer: *** -Vaccines:  Immunization History  Administered Date(s) Administered   Influenza,inj,Quad PF,6+ Mos 01/02/2021   Influenza-Unspecified 11/17/2016, 12/22/2017   PFIZER(Purple Top)SARS-COV-2 Vaccination 04/26/2019, 05/18/2019   Pfizer Covid-19 Vaccine Bivalent Booster 25yrs & up 01/19/2020, 12/26/2020   Tdap 06/19/2019   Last Tdap: UTD / Flu: DECLINED / COVID: UTD / Gardasil: AGE OUT / Shingles (50+): *** / PCV20:  -Hep C screen: UTD -Last lipid / glucose screening: UTD  > Exercise: {misc; exercise types:16438}, {exercise level:31265} > Dietary  Supplements: Folate: {yes/no:20286};  Calcium: {yes/no:20286}}; Vitamin D: {yes/no:20286} > There is no height or weight on file to calculate BMI.  > Recent dental visit {yes no:314532} > Seat Belt Use: {yes no:314532} > Texting and driving? {yes no:314532} > Guns in the house {yes no:314532} > Recreational or other drug use: {Drug Use:32241}   Social History   Tobacco Use   Smoking status: Never   Smokeless tobacco: Never  Substance Use Topics   Alcohol use: Yes    Alcohol/week: 0.0 standard drinks of alcohol    Comment: rarely   Occupation: ***    Lives with: ***    PHQ-2 Score: In last two weeks, how often have you felt: Little interest or pleasure in doing things: {PHQGADfrequency:29690::"Not at all (0)"} Feeling down, depressed or hopeless: {PHQGADfrequency:29690::"Not at all (0)"} Score:   GAD-2 Over the last 2 weeks, how often have you been bothered by the following problems? Feeling nervous, anxious or on edge: {PHQGADfrequency:29690::"Not at all (0)"} Not being able to stop or control worrying: {PHQGADfrequency:29690::"Not at all (0)"}} Score: _________________________________________________________  Current Outpatient Medications  Medication Sig Dispense Refill   albuterol (VENTOLIN HFA) 108 (90 Base) MCG/ACT inhaler USE PRIOR TO EXERCISE AS DIRECTED 18 g 0   amLODipine (NORVASC) 5 MG tablet Take 1 tablet (5 mg total) by mouth daily. 90 tablet 3   azelastine (ASTELIN) 0.1 % nasal spray PLACE 1 SPRAY INTO BOTH NOSTRILS 2 TIMES DAILY. USE IN EACH NOSTRIL AS DIRECTED 30 mL 12   Blood Pressure Monitoring (OMRON 3 SERIES BP MONITOR) DEVI Use as directed 1 each 0   Drospirenone (SLYND) 4 MG TABS Take 1 tablet (4 mg  total) by mouth daily. 84 tablet 3   hydrOXYzine (VISTARIL) 50 MG capsule Take 1 capsule (50 mg total) by mouth at bedtime as needed. 30 capsule 0   Insulin Pen Needle (UNIFINE PENTIPS) 31G X 5 MM MISC Use as directed with Saxenda 100 each 0   meloxicam  (MOBIC) 7.5 MG tablet Take 1 tablet (7.5 mg total) by mouth 2 (two) times daily with a meal. 60 tablet 1   methylPREDNISolone (MEDROL) 4 MG TBPK tablet Take 1 dose pk by oral route. Follow instructions on package 21 tablet 0   triamterene-hydrochlorothiazide (MAXZIDE-25) 37.5-25 MG tablet TAKE 1 TABLET BY MOUTH DAILY. 90 tablet 3   trimethoprim-polymyxin b (POLYTRIM) ophthalmic solution Place 1 drop into the left eye every 4 (four) hours. 10 mL 0   No current facility-administered medications for this visit.   Allergies  Allergen Reactions   Ciprofloxacin Hives   Codeine Nausea And Vomiting   Gabapentin Nausea And Vomiting   Hydrocodone-Acetaminophen Nausea And Vomiting   Hydromorphone Hcl Nausea And Vomiting   Oxycodone-Acetaminophen Nausea And Vomiting   Promethazine Hcl Nausea And Vomiting   Tramadol Nausea And Vomiting   Sulfa Antibiotics Rash    hives hives    Past Medical History:  Diagnosis Date   Brain hemangioma (HCC)    Benig in 2018, never went to get head MRI   Hypertension    Migraine    Past Surgical History:  Procedure Laterality Date   BREAST BIOPSY Right 06/03/2018   X clip stereo bx CALCIUM OXALATE CRYSTALS WITHIN APOCRINE MICROCYSTS USUAL DUCTAL HYPERPLASIA, COLUMNAR CELL CHANGE, AND FOCAL PSEUDOANGIOMATOUS STROMAL HYPERPLASIA (PASH   BREAST CYST ASPIRATION Right 10+ yrs ago   CHOLECYSTECTOMY     COLONOSCOPY WITH PROPOFOL N/A 07/04/2021   Procedure: COLONOSCOPY WITH PROPOFOL;  Surgeon: Toney Reil, MD;  Location: [facility];  Service: Gastroenterology;  Laterality: N/A;   FOOT SURGERY      Review Of Systems  Constitutional: Denied constitutional symptoms, night sweats, recent illness, fatigue, fever, insomnia and weight loss.  Eyes: Denied eye symptoms, eye pain, photophobia, vision change and visual disturbance.  Ears/Nose/Throat/Neck: Denied ear, nose, throat or neck symptoms, hearing loss, nasal discharge, sinus congestion and sore throat.   Cardiovascular: Denied cardiovascular symptoms, arrhythmia, chest pain/pressure, edema, exercise intolerance, orthopnea and palpitations.  Respiratory: Denied pulmonary symptoms, asthma, pleuritic pain, productive sputum, cough, dyspnea and wheezing.  Gastrointestinal: Denied, gastro-esophageal reflux, melena, nausea and vomiting.  Genitourinary:*** Denied genitourinary symptoms including symptomatic vaginal discharge, pelvic relaxation issues, and urinary complaints.  Musculoskeletal: Denied musculoskeletal symptoms, stiffness, swelling, muscle weakness and myalgia.  Dermatologic: Denied dermatology symptoms, rash and scar.  Neurologic: Denied neurology symptoms, dizziness, headache, neck pain and syncope.  Psychiatric: Denied psychiatric symptoms, anxiety and depression.  Endocrine: Denied endocrine symptoms including hot flashes and night sweats.      Objective:    There were no vitals taken for this visit.  Constitutional: Well-developed, well-nourished female in no acute distress Neurological: Alert and oriented to person, place, and time Psychiatric: Mood and affect appropriate Skin: No rashes or lesions Neck: Supple without masses. Trachea is midline.Thyroid is normal size without masses Lymphatics: No cervical, axillary, supraclavicular, or inguinal adenopathy noted Respiratory: Clear to auscultation bilaterally. Good air movement with normal work of breathing. Cardiovascular: Regular rate and rhythm. Extremities grossly normal, nontender with no edema; pulses regular Gastrointestinal: Soft, nontender, nondistended. No masses or hernias appreciated. No hepatosplenomegaly. No fluid wave. No rebound or guarding. Breast Exam: {Exam; breast:13139::"normal appearance, no  masses or tenderness"} Genitourinary:         External Genitalia: Normal female genitalia    Vagina: Normal mucosa, no lesions.    Cervix: No lesions, normal size and consistency; no cervical motion tenderness;  non-friable; Pap not***obtained.    Uterus: Normal size and contour; smooth, mobile, NT, {Desc; anteverted/retroverted/midposition:60613}. Adnexae: Non-palpable and non-tender Perineum/Anus: No lesions Rectal: deferred    Assessment/Plan:    Ashley [name] is a 54 y.o. female No obstetric history on file. with normal well-woman gynecologic exam.  -Screenings:  Pap: done with cotesting today  *** w/rflx today Mammogram: ordered***due *** Colon: ordered colonoscopy***Cologuard -OR- due *** Labs: ***A1C, CMP, HepC, Lipid panel, Vit D, TSH GAD***PHQ-2 = ***, discussed coping techniques; follow up with PCP if worsens or develops concern -Contraception: *** -Vaccines: UTD, Tdap today; pt declines Influenza. Gardasil: series not started, declined today.  -Healthy lifestyle modifications discussed: multivitamin, diet, exercise, sunscreen, tobacco and alcohol use. Emphasized importance of regular physical activity.  -Folate***Calcium and Vit D recommendation reviewed.  -All questions answered to patient's satisfaction.  -RTC 1 yr for annual, sooner prn.   No follow-ups on file.    Julieanne Manson, DO [facility] at [facility]

## 2023-05-25 NOTE — Patient Instructions (Signed)
 Preventive Care 16-55 Years Old, Female  Preventive care refers to lifestyle choices and visits with your health care provider that can promote health and wellness. Preventive care visits are also called wellness exams.  What can I expect for my preventive care visit?  Counseling  Your health care provider may ask you questions about your:  Medical history, including:  Past medical problems.  Family medical history.  Pregnancy history.  Current health, including:  Menstrual cycle.  Method of birth control.  Emotional well-being.  Home life and relationship well-being.  Sexual activity and sexual health.  Lifestyle, including:  Alcohol, nicotine or tobacco, and drug use.  Access to firearms.  Diet, exercise, and sleep habits.  Work and work Astronomer.  Sunscreen use.  Safety issues such as seatbelt and bike helmet use.  Physical exam  Your health care provider will check your:  Height and weight. These may be used to calculate your BMI (body mass index). BMI is a measurement that tells if you are at a healthy weight.  Waist circumference. This measures the distance around your waistline. This measurement also tells if you are at a healthy weight and may help predict your risk of certain diseases, such as type 2 diabetes and high blood pressure.  Heart rate and blood pressure.  Body temperature.  Skin for abnormal spots.  What immunizations do I need?    Vaccines are usually given at various ages, according to a schedule. Your health care provider will recommend vaccines for you based on your age, medical history, and lifestyle or other factors, such as travel or where you work.  What tests do I need?  Screening  Your health care provider may recommend screening tests for certain conditions. This may include:  Lipid and cholesterol levels.  Diabetes screening. This is done by checking your blood sugar (glucose) after you have not eaten for a while (fasting).  Pelvic exam and Pap test.  Hepatitis B test.  Hepatitis C  test.  HIV (human immunodeficiency virus) test.  STI (sexually transmitted infection) testing, if you are at risk.  Lung cancer screening.  Colorectal cancer screening.  Mammogram. Talk with your health care provider about when you should start having regular mammograms. This may depend on whether you have a family history of breast cancer.  BRCA-related cancer screening. This may be done if you have a family history of breast, ovarian, tubal, or peritoneal cancers.  Bone density scan. This is done to screen for osteoporosis.  Talk with your health care provider about your test results, treatment options, and if necessary, the need for more tests.  Follow these instructions at home:  Eating and drinking    Eat a diet that includes fresh fruits and vegetables, whole grains, lean protein, and low-fat dairy products.  Take vitamin and mineral supplements as recommended by your health care provider.  Do not drink alcohol if:  Your health care provider tells you not to drink.  You are pregnant, may be pregnant, or are planning to become pregnant.  If you drink alcohol:  Limit how much you have to 0-1 drink a day.  Know how much alcohol is in your drink. In the [place] one drink equals one 12 oz bottle of beer (355 mL), one 5 oz glass of wine (148 mL), or one 1 oz glass of hard liquor (44 mL).  Lifestyle  Brush your teeth every morning and night with fluoride toothpaste. Floss one time each day.  Exercise for at least  30 minutes 5 or more days each week.  Do not use any products that contain nicotine or tobacco. These products include cigarettes, chewing tobacco, and vaping devices, such as e-cigarettes. If you need help quitting, ask your health care provider.  Do not use drugs.  If you are sexually active, practice safe sex. Use a condom or other form of protection to prevent STIs.  If you do not wish to become pregnant, use a form of birth control. If you plan to become pregnant, see your health care provider for a  prepregnancy visit.  Take aspirin only as told by your health care provider. Make sure that you understand how much to take and what form to take. Work with your health care provider to find out whether it is safe and beneficial for you to take aspirin daily.  Find healthy ways to manage stress, such as:  Meditation, yoga, or listening to music.  Journaling.  Talking to a trusted person.  Spending time with friends and family.  Minimize exposure to UV radiation to reduce your risk of skin cancer.  Safety  Always wear your seat belt while driving or riding in a vehicle.  Do not drive:  If you have been drinking alcohol. Do not ride with someone who has been drinking.  When you are tired or distracted.  While texting.  If you have been using any mind-altering substances or drugs.  Wear a helmet and other protective equipment during sports activities.  If you have firearms in your house, make sure you follow all gun safety procedures.  Seek help if you have been physically or sexually abused.  What's next?  Visit your health care provider once a year for an annual wellness visit.  Ask your health care provider how often you should have your eyes and teeth checked.  Stay up to date on all vaccines.  This information is not intended to replace advice given to you by your health care provider. Make sure you discuss any questions you have with your health care provider.  Document Revised: 08/21/2020 Document Reviewed: 08/21/2020  Elsevier Patient Education  2024 [employer].

## 2023-05-26 ENCOUNTER — Encounter: Payer: Self-pay | Admitting: Obstetrics

## 2023-05-26 ENCOUNTER — Other Ambulatory Visit (HOSPITAL_COMMUNITY)
Admission: RE | Admit: 2023-05-26 | Discharge: 2023-05-26 | Disposition: A | Discharge status: Home / Self Care | Source: Ambulatory Visit | Attending: Obstetrics | Admitting: Obstetrics

## 2023-05-26 ENCOUNTER — Ambulatory Visit (INDEPENDENT_AMBULATORY_CARE_PROVIDER_SITE_OTHER): Admitting: Obstetrics

## 2023-05-26 VITALS — BP 122/69 | HR 81 | Ht 64.0 in | Wt 178.0 lb

## 2023-05-26 DIAGNOSIS — R4586 Emotional lability: Secondary | ICD-10-CM | POA: Diagnosis not present

## 2023-05-26 DIAGNOSIS — N911 Secondary amenorrhea: Secondary | ICD-10-CM

## 2023-05-26 DIAGNOSIS — Z124 Encounter for screening for malignant neoplasm of cervix: Secondary | ICD-10-CM | POA: Insufficient documentation

## 2023-05-26 DIAGNOSIS — Z01419 Encounter for gynecological examination (general) (routine) without abnormal findings: Secondary | ICD-10-CM

## 2023-05-26 DIAGNOSIS — Z1231 Encounter for screening mammogram for malignant neoplasm of breast: Secondary | ICD-10-CM

## 2023-05-27 ENCOUNTER — Other Ambulatory Visit: Payer: Self-pay

## 2023-05-27 ENCOUNTER — Other Ambulatory Visit: Payer: Self-pay | Admitting: Obstetrics

## 2023-05-27 DIAGNOSIS — N951 Menopausal and female climacteric states: Secondary | ICD-10-CM

## 2023-05-27 LAB — ESTRADIOL: Estradiol: 12.3 pg/mL

## 2023-05-27 LAB — HCG, SERUM, QUALITATIVE: hCG,Beta Subunit,Qual,Serum: NEGATIVE m[IU]/mL (ref <6)

## 2023-05-27 LAB — TSH RFX ON ABNORMAL TO FREE T4: TSH: 1.16 u[IU]/mL (ref 0.450–4.500)

## 2023-05-27 LAB — PROLACTIN: Prolactin: 10.2 ng/mL (ref 3.6–25.2)

## 2023-05-27 LAB — FSH/LH
FSH: 30 m[IU]/mL
LH: 23 m[IU]/mL

## 2023-05-27 MED ORDER — ESTRADIOL 1 MG PO TABS
1.0000 mg | ORAL_TABLET | Freq: Every day | ORAL | 1 refills | Status: DC
Start: 2023-05-27 — End: 2023-11-26
  Filled 2023-05-27: qty 90, 90d supply, fill #0
  Filled 2023-07-07 – 2023-08-20 (×2): qty 90, 90d supply, fill #1

## 2023-05-27 NOTE — Progress Notes (Signed)
Rx for estradiol

## 2023-05-28 LAB — CYTOLOGY - PAP
Comment: NEGATIVE
Diagnosis: NEGATIVE
High risk HPV: NEGATIVE

## 2023-05-31 ENCOUNTER — Encounter: Payer: Self-pay | Admitting: Obstetrics

## 2023-06-04 DIAGNOSIS — F411 Generalized anxiety disorder: Secondary | ICD-10-CM | POA: Diagnosis not present

## 2023-06-04 DIAGNOSIS — H5213 Myopia, bilateral: Secondary | ICD-10-CM | POA: Diagnosis not present

## 2023-06-04 DIAGNOSIS — H524 Presbyopia: Secondary | ICD-10-CM | POA: Diagnosis not present

## 2023-06-25 DIAGNOSIS — F411 Generalized anxiety disorder: Secondary | ICD-10-CM | POA: Diagnosis not present

## 2023-07-07 ENCOUNTER — Other Ambulatory Visit: Payer: Self-pay

## 2023-07-07 ENCOUNTER — Other Ambulatory Visit: Payer: Self-pay | Admitting: Physician Assistant

## 2023-07-07 DIAGNOSIS — F5101 Primary insomnia: Secondary | ICD-10-CM

## 2023-07-09 ENCOUNTER — Ambulatory Visit
Admission: RE | Admit: 2023-07-09 | Discharge: 2023-07-09 | Disposition: A | Discharge status: Home / Self Care | Source: Ambulatory Visit | Attending: Obstetrics | Admitting: Obstetrics

## 2023-07-09 DIAGNOSIS — Z1231 Encounter for screening mammogram for malignant neoplasm of breast: Secondary | ICD-10-CM

## 2023-07-16 DIAGNOSIS — F411 Generalized anxiety disorder: Secondary | ICD-10-CM | POA: Diagnosis not present

## 2023-07-23 ENCOUNTER — Encounter: Payer: Self-pay | Admitting: Family Medicine

## 2023-07-23 ENCOUNTER — Other Ambulatory Visit: Payer: Self-pay

## 2023-07-23 ENCOUNTER — Ambulatory Visit (INDEPENDENT_AMBULATORY_CARE_PROVIDER_SITE_OTHER): Payer: 59 | Admitting: Family Medicine

## 2023-07-23 VITALS — BP 117/79 | HR 77 | Ht 64.0 in | Wt 180.0 lb

## 2023-07-23 DIAGNOSIS — Z0000 Encounter for general adult medical examination without abnormal findings: Secondary | ICD-10-CM

## 2023-07-23 DIAGNOSIS — I10 Essential (primary) hypertension: Secondary | ICD-10-CM

## 2023-07-23 DIAGNOSIS — E559 Vitamin D deficiency, unspecified: Secondary | ICD-10-CM | POA: Diagnosis not present

## 2023-07-23 DIAGNOSIS — F5101 Primary insomnia: Secondary | ICD-10-CM | POA: Diagnosis not present

## 2023-07-23 DIAGNOSIS — E782 Mixed hyperlipidemia: Secondary | ICD-10-CM | POA: Diagnosis not present

## 2023-07-23 DIAGNOSIS — Z130 Encounter for screening for diseases of the blood and blood-forming organs and certain disorders involving the immune mechanism: Secondary | ICD-10-CM | POA: Diagnosis not present

## 2023-07-23 DIAGNOSIS — R7303 Prediabetes: Secondary | ICD-10-CM | POA: Diagnosis not present

## 2023-07-23 DIAGNOSIS — B309 Viral conjunctivitis, unspecified: Secondary | ICD-10-CM

## 2023-07-23 MED ORDER — AMLODIPINE BESYLATE 5 MG PO TABS
5.0000 mg | ORAL_TABLET | Freq: Every day | ORAL | 3 refills | Status: AC
Start: 2023-07-23 — End: 2024-07-22
  Filled 2023-07-23: qty 90, 90d supply, fill #0
  Filled 2023-10-27: qty 90, 90d supply, fill #1
  Filled 2023-12-21 – 2024-01-25 (×2): qty 90, 90d supply, fill #2

## 2023-07-23 MED ORDER — SLYND 4 MG PO TABS
1.0000 | ORAL_TABLET | Freq: Every day | ORAL | 3 refills | Status: AC
  Filled 2023-07-23: qty 84, 84d supply, fill #0
  Filled 2023-10-15: qty 84, 84d supply, fill #1
  Filled 2023-12-21: qty 84, 84d supply, fill #2
  Filled 2024-03-24: qty 84, 84d supply, fill #3

## 2023-07-23 MED ORDER — TRIAMTERENE-HCTZ 37.5-25 MG PO TABS
1.0000 | ORAL_TABLET | Freq: Every day | ORAL | 3 refills | Status: DC
Start: 2023-07-23 — End: 2024-01-21
  Filled 2023-07-23: qty 90, 90d supply, fill #0
  Filled 2023-10-27: qty 90, 90d supply, fill #1

## 2023-07-23 NOTE — Progress Notes (Signed)
 Complete physical exam   Patient: Ashley [name]   DOB: 1970-04-29   53 y.o. Female  MRN: [id number] Visit Date: 07/23/2023  Today's healthcare provider: Mimi Alt, MD   Chief Complaint  Patient presents with   Annual Exam    No concerns just needs meds filled    Care Management    Pattern of eating  General   Are you exercising:yes  What exercising:walking  How long: 1 hour  How frequent: 3/4 times a week   Denied vaccines     Subjective    Ashley [name] is a 53 y.o. female who presents today for a complete physical exam.   She reports consuming a general diet.   Walks 1 hour 3-4 times per week     She does not have additional problems to discuss today.   Discussed the use of AI scribe software for clinical note transcription with the patient, who gave verbal consent to proceed.  History of Present Illness Ashley [name] is a 53 year old female who presents for an annual physical exam.  She is up to date on her Pap smear, which was negative for intraepithelial lesions and high-risk HPV as of May 26, 2023. Her mammogram in May 2025 was also negative with a BI-RADS score of 1.  She underwent a colonoscopy in 2023, which revealed benign but precancerous polyps. A follow-up colonoscopy is recommended in 2026.  She is currently on triamterene  37.5/25 mg for hypertension and amlodipine  5 mg daily. Her blood pressure is well-controlled with a recent reading of 117/79 mmHg. She also takes a 4 mg contraceptive pill.  She engages in regular physical activity, aiming for 150 minutes per week, and follows a balanced diet including protein, starch, and vegetables. She has never smoked tobacco.     Past Medical History:  Diagnosis Date   Brain hemangioma (HCC)    Benig in 2018, never went to get head MRI   Hypertension    Migraine    Past Surgical History:  Procedure Laterality Date   BREAST BIOPSY Right 06/03/2018   X clip stereo bx CALCIUM  OXALATE CRYSTALS WITHIN APOCRINE MICROCYSTS USUAL DUCTAL HYPERPLASIA, COLUMNAR CELL CHANGE, AND FOCAL PSEUDOANGIOMATOUS STROMAL HYPERPLASIA (PASH   BREAST CYST ASPIRATION Right 10+ yrs ago   CHOLECYSTECTOMY     COLONOSCOPY WITH PROPOFOL  N/A 07/04/2021   Procedure: COLONOSCOPY WITH PROPOFOL ;  Surgeon: Selena Daily, MD;  Location: [facility] ENDOSCOPY;  Service: Gastroenterology;  Laterality: N/A;   FOOT SURGERY     Social History   Socioeconomic History   Marital status: Single    Spouse name: Not on file   Number of children: Not on file   Years of education: Not on file   Highest education level: Not on file  Occupational History   Not on file  Tobacco Use   Smoking status: Never   Smokeless tobacco: Never  Vaping Use   Vaping status: Never Used  Substance and Sexual Activity   Alcohol use: Yes    Alcohol/week: 0.0 standard drinks of alcohol    Comment: rarely   Drug use: Never   Sexual activity: Yes    Birth control/protection: Pill  Other Topics Concern   Not on file  Social History Narrative   Not on file   Social Drivers of Health   Financial Resource Strain: Low Risk  (07/23/2023)   Overall Financial Resource Strain (CARDIA)    Difficulty of Paying Living Expenses: Not hard at all  Food  Insecurity: No Food Insecurity (07/23/2023)   Hunger Vital Sign    Worried About Running Out of Food in the Last Year: Never true    Ran Out of Food in the Last Year: Never true  Transportation Needs: No Transportation Needs (07/23/2023)   PRAPARE - Administrator, Civil Service (Medical): No    Lack of Transportation (Non-Medical): No  Physical Activity: Not on file  Stress: No Stress Concern Present (07/23/2023)   [employer] of Occupational Health - Occupational Stress Questionnaire    Feeling of Stress : Not at all  Social Connections: Not on file  Intimate Partner Violence: Not At Risk (07/23/2023)   Humiliation, Afraid, Rape, and Kick questionnaire    Fear  of Current or Ex-Partner: No    Emotionally Abused: No    Physically Abused: No    Sexually Abused: No   Family Status  Relation Name Status   Mother  Alive   Father  Deceased   Sister  Deceased   Brother  Alive   MGM  Deceased   MGF  Deceased   PGM  Deceased   PGF  Deceased   Sister  Alive   Sister  Alive   Brother  Alive   Brother  Alive   Neg Hx  (Not Specified)  No partnership data on file   Family History  Problem Relation Age of Onset   Hypertension Mother    Hyperlipidemia Mother    Hypertension Father    Hyperlipidemia Father    Diabetes Mellitus I Sister    Diabetes Mellitus I Brother    Diabetes Mellitus II Paternal Actor    Healthy Sister    Healthy Sister    Healthy Brother    Healthy Brother    Breast cancer Neg Hx    Colon cancer Neg Hx    Ovarian cancer Neg Hx    Allergies  Allergen Reactions   Ciprofloxacin Hives   Codeine Nausea And Vomiting   Gabapentin Nausea And Vomiting   Hydrocodone-Acetaminophen Nausea And Vomiting   Hydromorphone Hcl Nausea And Vomiting   Oxycodone-Acetaminophen Nausea And Vomiting   Promethazine Hcl Nausea And Vomiting   Tramadol Nausea And Vomiting   Sulfa  Antibiotics Rash    hives hives     Medications: Outpatient Medications Prior to Visit  Medication Sig   Blood Pressure Monitoring (OMRON 3 SERIES BP MONITOR) DEVI Use as directed   estradiol  (ESTRACE ) 1 MG tablet Take 1 tablet (1 mg total) by mouth daily. (Patient not taking: Reported on 07/23/2023)   meloxicam  (MOBIC ) 7.5 MG tablet Take 1 tablet (7.5 mg total) by mouth 2 (two) times daily with a meal.   trimethoprim -polymyxin b  (POLYTRIM ) ophthalmic solution Place 1 drop into the left eye every 4 (four) hours.   [DISCONTINUED] Drospirenone  (SLYND ) 4 MG TABS Take 1 tablet (4 mg total) by mouth daily.   albuterol  (VENTOLIN  HFA) 108 (90 Base) MCG/ACT inhaler USE PRIOR TO EXERCISE AS DIRECTED   azelastine  (ASTELIN ) 0.1 % nasal spray PLACE 1 SPRAY INTO BOTH  NOSTRILS 2 TIMES DAILY. USE IN EACH NOSTRIL AS DIRECTED   hydrOXYzine  (VISTARIL ) 50 MG capsule Take 1 capsule (50 mg total) by mouth at bedtime as needed. (Patient not taking: Reported on 07/23/2023)   Insulin  Pen Needle (UNIFINE PENTIPS) 31G X 5 MM MISC Use as directed with Saxenda  (Patient not taking: Reported on 07/23/2023)   methylPREDNISolone  (MEDROL ) 4 MG TBPK tablet Take 1 dose pk by oral route. Follow instructions on  package (Patient not taking: Reported on 07/23/2023)   [DISCONTINUED] amLODipine  (NORVASC ) 5 MG tablet Take 1 tablet (5 mg total) by mouth daily.   [DISCONTINUED] triamterene -hydrochlorothiazide  (MAXZIDE -25) 37.5-25 MG tablet TAKE 1 TABLET BY MOUTH DAILY.   No facility-administered medications prior to visit.    Review of Systems  Last CBC Lab Results  Component Value Date   WBC 10.5 06/19/2022   HGB 14.2 06/19/2022   HCT 42.2 06/19/2022   MCV 87 06/19/2022   MCH 29.2 06/19/2022   RDW 11.8 06/19/2022   PLT 383 06/19/2022   Last metabolic panel Lab Results  Component Value Date   GLUCOSE 108 (H) 06/19/2022   NA 144 06/19/2022   K 4.4 06/19/2022   CL 106 06/19/2022   CO2 23 06/19/2022   BUN 10 06/19/2022   CREATININE 0.93 06/19/2022   EGFR 73 06/19/2022   CALCIUM 9.5 06/19/2022   PROT 7.0 06/19/2022   ALBUMIN 3.9 06/19/2022   LABGLOB 3.1 06/19/2022   AGRATIO 1.3 06/19/2022   BILITOT 0.4 06/19/2022   ALKPHOS 110 06/19/2022   AST 18 06/19/2022   ALT 39 (H) 06/19/2022   ANIONGAP 7 07/20/2014   Last lipids Lab Results  Component Value Date   CHOL 208 (H) 06/19/2022   HDL 47 06/19/2022   LDLCALC 139 (H) 06/19/2022   TRIG 124 06/19/2022   CHOLHDL 4.4 06/19/2022   Last hemoglobin A1c Lab Results  Component Value Date   HGBA1C 6.3 (H) 06/19/2022   Last thyroid  functions Lab Results  Component Value Date   TSH 1.160 05/26/2023   Last vitamin D  Lab Results  Component Value Date   VD25OH 31.1 05/30/2021   Last vitamin B12 and Folate Lab  Results  Component Value Date   VITAMINB12 597 05/24/2020   FOLATE 11.3 05/24/2020       Objective    BP 117/79   Pulse 77   Ht 5\' 4"  (1.626 m)   Wt 180 lb (81.6 kg)   SpO2 99%   BMI 30.90 kg/m  BP Readings from Last 3 Encounters:  07/23/23 117/79  05/26/23 122/69  07/10/22 116/71   Wt Readings from Last 3 Encounters:  07/23/23 180 lb (81.6 kg)  05/26/23 178 lb (80.7 kg)  07/10/22 181 lb 14.4 oz (82.5 kg)        Physical Exam Vitals reviewed.  Constitutional:      General: She is not in acute distress.    Appearance: Normal appearance. She is not ill-appearing, toxic-appearing or diaphoretic.  HENT:     Head: Normocephalic and atraumatic.     Right Ear: Tympanic membrane and external ear normal. There is no impacted cerumen.     Left Ear: Tympanic membrane and external ear normal. There is no impacted cerumen.     Nose: Nose normal.     Mouth/Throat:     Pharynx: Oropharynx is clear.  Eyes:     General: No scleral icterus.    Extraocular Movements: Extraocular movements intact.     Conjunctiva/sclera: Conjunctivae normal.     Pupils: Pupils are equal, round, and reactive to light.  Cardiovascular:     Rate and Rhythm: Normal rate and regular rhythm.     Pulses: Normal pulses.     Heart sounds: Normal heart sounds. No murmur heard.    No friction rub. No gallop.  Pulmonary:     Effort: Pulmonary effort is normal. No respiratory distress.     Breath sounds: Normal breath sounds. No wheezing, rhonchi or  rales.  Abdominal:     General: Bowel sounds are normal. There is no distension.     Palpations: Abdomen is soft. There is no mass.     Tenderness: There is no abdominal tenderness. There is no guarding.  Musculoskeletal:        General: No deformity.     Cervical back: Normal range of motion and neck supple.     Right lower leg: No edema.     Left lower leg: No edema.  Lymphadenopathy:     Cervical: No cervical adenopathy.  Skin:    General: Skin is  warm.     Capillary Refill: Capillary refill takes less than 2 seconds.     Findings: No erythema or rash.  Neurological:     General: No focal deficit present.     Mental Status: She is alert and oriented to person, place, and time.     Cranial Nerves: Cranial nerves 2-12 are intact. No cranial nerve deficit or facial asymmetry.     Motor: Motor function is intact. No weakness.     Gait: Gait normal.  Psychiatric:        Mood and Affect: Mood normal.        Behavior: Behavior normal.       Last depression screening scores    07/23/2023    9:15 AM 07/10/2022    1:15 PM 06/11/2022    3:42 PM  PHQ 2/9 Scores  PHQ - 2 Score 0 0 0  PHQ- 9 Score 2 2 2     Last fall risk screening    08/21/2021    8:12 AM  Fall Risk   Falls in the past year? 0  Number falls in past yr: 0  Injury with Fall? 0  Risk for fall due to : No Fall Risks    Last Audit-C alcohol use screening    07/23/2023    9:12 AM  Alcohol Use Disorder Test (AUDIT)  1. How often do you have a drink containing alcohol? 1  2. How many drinks containing alcohol do you have on a typical day when you are drinking? 0  3. How often do you have six or more drinks on one occasion? 0  AUDIT-C Score 1   A score of 3 or more in women, and 4 or more in men indicates increased risk for alcohol abuse, EXCEPT if all of the points are from question 1   No results found for any visits on 07/23/23.  Assessment & Plan    Routine Health Maintenance and Physical Exam  Immunization History  Administered Date(s) Administered   Influenza,inj,Quad PF,6+ Mos 01/02/2021   Influenza-Unspecified 11/17/2016, 12/22/2017   PFIZER(Purple Top)SARS-COV-2 Vaccination 04/26/2019, 05/18/2019   Pfizer Covid-19 Vaccine Bivalent Booster 16yrs & up 01/19/2020, 12/26/2020   Tdap 06/19/2019    Health Maintenance  Topic Date Due   Pneumococcal Vaccine 11-1 Years old (1 of 2 - PCV) Never done   Zoster Vaccines- Shingrix (1 of 2) Never done    COVID-19 Vaccine (5 - 2024-25 season) 11/08/2022   INFLUENZA VACCINE  10/08/2023   Colonoscopy  07/04/2024   MAMMOGRAM  07/08/2024   Cervical Cancer Screening (HPV/Pap Cotest)  05/25/2028   DTaP/Tdap/Td (2 - Td or Tdap) 06/18/2029   Hepatitis C Screening  Completed   HIV Screening  Completed   HPV VACCINES  Aged Out   Meningococcal B Vaccine  Aged Out   Fecal DNA (Cologuard)  Discontinued    Problem List Items  Addressed This Visit       Cardiovascular and Mediastinum   Essential hypertension   Relevant Medications   triamterene -hydrochlorothiazide  (MAXZIDE -25) 37.5-25 MG tablet   amLODipine  (NORVASC ) 5 MG tablet   Other Relevant Orders   CMP14+EGFR     Other   Primary insomnia   Prediabetes   Relevant Orders   Hemoglobin A1c   Moderate mixed hyperlipidemia not requiring statin therapy   Relevant Medications   triamterene -hydrochlorothiazide  (MAXZIDE -25) 37.5-25 MG tablet   amLODipine  (NORVASC ) 5 MG tablet   Other Relevant Orders   Lipid panel   Avitaminosis D   Relevant Orders   VITAMIN D  25 Hydroxy (Vit-D Deficiency, Fractures)   Annual physical exam - Primary   Chronic conditions are stable  Patient was counseled on benefits of regular physical activity with goal of 150 minutes of moderate to vigurous intensity 4 days per week  Patient was counseled to consume well balanced diet of fruits, vegetables, limited saturated fats and limited sugary foods and beverages with emphasis on consuming 6-8 glasses of water  daily  Screening recommended today: A1c, lipids,CMP,CBC  Colon cancer screening: UTD, Precancerous polyps in 2023, repeat in 2026    Cervical CA screening: UTD, NEG HPV, NILM   Mammogram: UTD, last completed 07/09/23, BIRADS 1 (NEG), will repeat in 1 year    Lung CA screening CT: N/A    Vaccines recommended today: COVID,Shingrix& Prevnar        Other Visit Diagnoses       Screening for deficiency anemia       Relevant Orders   CBC     Acute viral  conjunctivitis of left eye            Assessment & Plan Hypertension Hypertension is well-controlled with current medication regimen. Blood pressure reading is 117/79 mmHg, indicating effective management. - Continue triamterene  37.5 mg/25 mg. - Continue amlodipine  5 mg daily. - Schedule follow-up in 6 months to monitor blood pressure and medication efficacy.  General Health Maintenance She is up to date on her Pap smear and mammogram. Pap smear was negative for intraepithelial lesions and high-risk HPV as of March 2025. Mammogram in May showed BI-RADS 1, negative results. Colonoscopy in 2023 revealed benign but precancerous polyps, with a repeat colonoscopy recommended in 2026. - Order A1c, CBC, CMP, lipid panel, and vitamin D  level. - Encourage 150 minutes of exercise per week for weight maintenance, and 240 minutes per week for weight loss. - Advise a balanced diet with appropriate portions of protein, starch, and vegetables. - Schedule repeat colonoscopy in 2026.       Return in about 6 months (around 01/23/2024) for CHRONIC F/U.       Mimi Alt, MD  [facility] [phone] (phone) [phone] (fax)  [facility]

## 2023-07-23 NOTE — Assessment & Plan Note (Signed)
 Chronic conditions are stable  Patient was counseled on benefits of regular physical activity with goal of 150 minutes of moderate to vigurous intensity 4 days per week  Patient was counseled to consume well balanced diet of fruits, vegetables, limited saturated fats and limited sugary foods and beverages with emphasis on consuming 6-8 glasses of water  daily  Screening recommended today: A1c, lipids,CMP,CBC  Colon cancer screening: UTD, Precancerous polyps in 2023, repeat in 2026    Cervical CA screening: UTD, NEG HPV, NILM   Mammogram: UTD, last completed 07/09/23, BIRADS 1 (NEG), will repeat in 1 year    Lung CA screening CT: N/A    Vaccines recommended today: COVID,Shingrix& Prevnar

## 2023-07-24 LAB — CMP14+EGFR
ALT: 25 IU/L (ref 0–32)
AST: 19 IU/L (ref 0–40)
Albumin: 4.2 g/dL (ref 3.8–4.9)
Alkaline Phosphatase: 135 IU/L — ABNORMAL HIGH (ref 44–121)
BUN/Creatinine Ratio: 15 (ref 9–23)
BUN: 15 mg/dL (ref 6–24)
Bilirubin Total: 0.7 mg/dL (ref 0.0–1.2)
CO2: 21 mmol/L (ref 20–29)
Calcium: 9.3 mg/dL (ref 8.7–10.2)
Chloride: 99 mmol/L (ref 96–106)
Creatinine, Ser: 1.01 mg/dL — ABNORMAL HIGH (ref 0.57–1.00)
Globulin, Total: 3.1 g/dL (ref 1.5–4.5)
Glucose: 87 mg/dL (ref 70–99)
Potassium: 4 mmol/L (ref 3.5–5.2)
Sodium: 137 mmol/L (ref 134–144)
Total Protein: 7.3 g/dL (ref 6.0–8.5)
eGFR: 66 mL/min/{1.73_m2} (ref >59)

## 2023-07-24 LAB — VITAMIN D 25 HYDROXY (VIT D DEFICIENCY, FRACTURES): Vit D, 25-Hydroxy: 33 ng/mL (ref 30.0–100.0)

## 2023-07-24 LAB — LIPID PANEL
Chol/HDL Ratio: 3.8 ratio (ref 0.0–4.4)
Cholesterol, Total: 249 mg/dL — ABNORMAL HIGH (ref 100–199)
HDL: 65 mg/dL (ref >39)
LDL Chol Calc (NIH): 166 mg/dL — ABNORMAL HIGH (ref 0–99)
Triglycerides: 101 mg/dL (ref 0–149)
VLDL Cholesterol Cal: 18 mg/dL (ref 5–40)

## 2023-07-24 LAB — CBC
Hematocrit: 47.2 % — ABNORMAL HIGH (ref 34.0–46.6)
Hemoglobin: 15.9 g/dL (ref 11.1–15.9)
MCH: 29.7 pg (ref 26.6–33.0)
MCHC: 33.7 g/dL (ref 31.5–35.7)
MCV: 88 fL (ref 79–97)
Platelets: 348 10*3/uL (ref 150–450)
RBC: 5.36 x10E6/uL — ABNORMAL HIGH (ref 3.77–5.28)
RDW: 12.6 % (ref 11.7–15.4)
WBC: 8.4 10*3/uL (ref 3.4–10.8)

## 2023-07-24 LAB — HEMOGLOBIN A1C
Est. average glucose Bld gHb Est-mCnc: 120 mg/dL
Hgb A1c MFr Bld: 5.8 % — ABNORMAL HIGH (ref 4.8–5.6)

## 2023-07-26 ENCOUNTER — Ambulatory Visit: Payer: Self-pay | Admitting: Family Medicine

## 2023-07-30 DIAGNOSIS — F411 Generalized anxiety disorder: Secondary | ICD-10-CM | POA: Diagnosis not present

## 2023-09-20 NOTE — Progress Notes (Unsigned)
    GYNECOLOGY PROGRESS NOTE  Subjective:  PCP: Sharma Coyer, MD  Patient ID: Ashley [name], female    DOB: January 08, 1971, 53 y.o.   MRN: [id number]  HPI  Patient is a 53 y.o. [id number] female who presents for 3 month medication follow-up.  She was started on Estrace  in addition to her Slynd  in order to help with her peri/menopausal symptoms. She feels like her moods, memory and fatigue are actually worse since staring the Estrace .  She also reports once incidence of breakthrough bleeding on 6/30, which has never happened to her before. Her therapist feels she may have high functioning anxiety and she would like to discuss mental health medications.   The following portions of the patient's history were reviewed and updated as appropriate: allergies, current medications, past family history, past medical history, past social history, past surgical history, and problem list.  Review of Systems Pertinent items are noted in HPI.   Objective:   Blood pressure 106/70, pulse 82, height 5' 4 (1.626 m), weight 170 lb 8 oz (77.3 kg). Body mass index is 29.27 kg/m.  General appearance: alert, cooperative, and no distress Abdomen: soft, non-tender; bowel sounds normal; no masses,  no organomegaly Pelvic: deferred Extremities: extremities normal, atraumatic, no cyanosis or edema Neurologic: Grossly normal   Assessment/Plan:   1. Anxiety   2. Menopausal symptoms    Estradiol  not helping with pt's symptoms, which we previously discussed it may not. She is not having hot flashes, it was more a trial for her mental health symptoms. Recommend discontinuing the Estradiol  now, since it's not providing benefit. Pt not willing to do a trial off the Slynd  to see if she if she is post-menopausal or perimenopausal, [facility] 05/26/23 was 30. Her mother did not complete menopause until 69yo. She can continue the Slynd  alone as contraception. Recommend pt discuss her anxiety-related symptoms with her PCP and  see if they recommend medication.    Estil Mangle, DO [facility]

## 2023-09-22 ENCOUNTER — Encounter: Payer: Self-pay | Admitting: Obstetrics

## 2023-09-22 ENCOUNTER — Ambulatory Visit: Admitting: Obstetrics

## 2023-09-22 VITALS — BP 106/70 | HR 82 | Ht 64.0 in | Wt 170.5 lb

## 2023-09-22 DIAGNOSIS — N951 Menopausal and female climacteric states: Secondary | ICD-10-CM | POA: Diagnosis not present

## 2023-09-22 DIAGNOSIS — F419 Anxiety disorder, unspecified: Secondary | ICD-10-CM

## 2023-09-28 ENCOUNTER — Encounter: Payer: Self-pay | Admitting: Family Medicine

## 2023-09-28 ENCOUNTER — Ambulatory Visit (INDEPENDENT_AMBULATORY_CARE_PROVIDER_SITE_OTHER): Admitting: Family Medicine

## 2023-09-28 ENCOUNTER — Other Ambulatory Visit: Payer: Self-pay

## 2023-09-28 VITALS — BP 116/77 | HR 78 | Ht 64.0 in | Wt 169.0 lb

## 2023-09-28 DIAGNOSIS — F5101 Primary insomnia: Secondary | ICD-10-CM | POA: Diagnosis not present

## 2023-09-28 DIAGNOSIS — F419 Anxiety disorder, unspecified: Secondary | ICD-10-CM | POA: Diagnosis not present

## 2023-09-28 MED ORDER — TRAZODONE HCL 100 MG PO TABS
100.0000 mg | ORAL_TABLET | Freq: Every day | ORAL | 1 refills | Status: DC
Start: 2023-09-28 — End: 2024-01-21
  Filled 2023-09-28: qty 90, 90d supply, fill #0

## 2023-09-28 MED ORDER — ESCITALOPRAM OXALATE 10 MG PO TABS
10.0000 mg | ORAL_TABLET | Freq: Every day | ORAL | 1 refills | Status: DC
  Filled 2023-09-28: qty 90, 90d supply, fill #0
  Filled 2023-12-21: qty 90, 90d supply, fill #1

## 2023-09-28 NOTE — Progress Notes (Signed)
 "     Established patient visit   Patient: Ashley [name]   DOB: Feb 07, 1971   53 y.o. Female  MRN: [id number] Visit Date: 09/28/2023  Today's healthcare provider: Rockie Agent, MD   Chief Complaint  Patient presents with   Anxiety    Discuss possible treatment for high functional anxiety     Subjective     HPI     Anxiety    Additional comments: Discuss possible treatment for high functional anxiety        Last edited by Thelbert Eulalio HERO, CMA on 09/28/2023 10:55 AM.       Discussed the use of AI scribe software for clinical note transcription with the patient, who gave verbal consent to proceed.  History of Present Illness Ashley [name] is a 53 year old female with anxiety who presents for chronic follow-up of anxiety.  Over the past two months, she has experienced an increase in anxiety symptoms, with her GAD-7 score rising from 0 to 11. She is not currently on any anxiety medications but has previously used hydroxyzine  50 mg as needed. Her anxiety is described as 'creeping at your window,' and she has high-functioning anxiety. She experiences irritability and a tendency to worry, and sometimes finds herself preoccupied with concerns about her family. She also notes stomach discomfort after eating, which she did not initially associate with anxiety.  She has chronic insomnia characterized by difficulty staying asleep and never feeling rested, even after a full night's sleep. She wakes up in the middle of the night and is unable to return to sleep due to racing thoughts about tasks and responsibilities. Despite occasionally sleeping through the night, she never feels rested. No regular snoring and a past sleep study was normal. She has tried various sleep aids, including trazodone  and magnesium glycinate, with inconsistent results. She is sensitive to medications that cause drowsiness and has a history of adverse reactions to gabapentin.  She has a history of  perimenopausal symptoms and was previously on hormone replacement therapy, which she discontinued due to lack of perceived benefit. She has been experiencing increased emotionality and irritability, which she associates with perimenopause.  Her current lifestyle changes include increased exercise and a diet focused on fruits and vegetables, which she notes has reduced her joint pain. She has reduced her use of ibuprofen and other NSAIDs.     Past Medical History:  Diagnosis Date   Brain hemangioma (HCC)    Benig in 2018, never went to get head MRI   Hypertension    Migraine     Medications: Outpatient Medications Prior to Visit  Medication Sig   triamterene -hydrochlorothiazide  (MAXZIDE -25) 37.5-25 MG tablet Take 1 tablet by mouth daily.   albuterol  (VENTOLIN  HFA) 108 (90 Base) MCG/ACT inhaler USE PRIOR TO EXERCISE AS DIRECTED (Patient not taking: Reported on 09/28/2023)   amLODipine  (NORVASC ) 5 MG tablet Take 1 tablet (5 mg total) by mouth daily.   azelastine  (ASTELIN ) 0.1 % nasal spray PLACE 1 SPRAY INTO BOTH NOSTRILS 2 TIMES DAILY. USE IN EACH NOSTRIL AS DIRECTED (Patient not taking: Reported on 09/28/2023)   Blood Pressure Monitoring (OMRON 3 SERIES BP MONITOR) DEVI Use as directed   Drospirenone  (SLYND ) 4 MG TABS Take 1 tablet (4 mg total) by mouth daily.   estradiol  (ESTRACE ) 1 MG tablet Take 1 tablet (1 mg total) by mouth daily. (Patient not taking: Reported on 09/28/2023)   hydrOXYzine  (VISTARIL ) 50 MG capsule Take 1 capsule (50 mg total) by mouth at bedtime as  needed. (Patient not taking: Reported on 09/28/2023)   Insulin  Pen Needle (UNIFINE PENTIPS) 31G X 5 MM MISC Use as directed with Saxenda  (Patient not taking: Reported on 09/28/2023)   meloxicam  (MOBIC ) 7.5 MG tablet Take 1 tablet (7.5 mg total) by mouth 2 (two) times daily with a meal.   methylPREDNISolone  (MEDROL ) 4 MG TBPK tablet Take 1 dose pk by oral route. Follow instructions on package (Patient not taking: Reported on  09/22/2023)   trimethoprim -polymyxin b  (POLYTRIM ) ophthalmic solution Place 1 drop into the left eye every 4 (four) hours. (Patient not taking: Reported on 09/28/2023)   No facility-administered medications prior to visit.    Review of Systems      09/28/2023   10:59 AM 07/23/2023    9:16 AM 02/07/2021   10:48 AM  GAD 7 : Generalized Anxiety Score  Nervous, Anxious, on Edge 1 0 0  Control/stop worrying 2 0 0  Worry too much - different things 1 0 0  Trouble relaxing 2 0 0  Restless 1 0 0  Easily annoyed or irritable 3 0 1  Afraid - awful might happen 1 0 0  Total GAD 7 Score 11 0 1  Anxiety Difficulty Not difficult at all  Not difficult at all    [facility] Office Visit from 09/28/2023 in [facility]  PHQ-9 Total Score 3    Last CBC Lab Results  Component Value Date   WBC 8.4 07/23/2023   HGB 15.9 07/23/2023   HCT 47.2 (H) 07/23/2023   MCV 88 07/23/2023   MCH 29.7 07/23/2023   RDW 12.6 07/23/2023   PLT 348 07/23/2023   Last metabolic panel Lab Results  Component Value Date   GLUCOSE 87 07/23/2023   NA 137 07/23/2023   K 4.0 07/23/2023   CL 99 07/23/2023   CO2 21 07/23/2023   BUN 15 07/23/2023   CREATININE 1.01 (H) 07/23/2023   EGFR 66 07/23/2023   CALCIUM 9.3 07/23/2023   PROT 7.3 07/23/2023   ALBUMIN 4.2 07/23/2023   LABGLOB 3.1 07/23/2023   AGRATIO 1.3 06/19/2022   BILITOT 0.7 07/23/2023   ALKPHOS 135 (H) 07/23/2023   AST 19 07/23/2023   ALT 25 07/23/2023   ANIONGAP 7 07/20/2014   Last lipids Lab Results  Component Value Date   CHOL 249 (H) 07/23/2023   HDL 65 07/23/2023   LDLCALC 166 (H) 07/23/2023   TRIG 101 07/23/2023   CHOLHDL 3.8 07/23/2023   Last hemoglobin A1c Lab Results  Component Value Date   HGBA1C 5.8 (H) 07/23/2023   Last thyroid  functions Lab Results  Component Value Date   TSH 1.160 05/26/2023         Objective    BP 116/77   Pulse 78   Ht 5' 4 (1.626 m)   Wt 169 lb (76.7 kg)   SpO2  98%   BMI 29.01 kg/m  BP Readings from Last 3 Encounters:  09/28/23 116/77  09/22/23 106/70  07/23/23 117/79   Wt Readings from Last 3 Encounters:  09/28/23 169 lb (76.7 kg)  09/22/23 170 lb 8 oz (77.3 kg)  07/23/23 180 lb (81.6 kg)        Physical Exam Constitutional:      General: She is not in acute distress.    Appearance: Normal appearance. She is normal weight. She is not ill-appearing, toxic-appearing or diaphoretic.     Comments: Well groomed, calmly sitting   Neurological:     Mental Status: She  is alert.  Psychiatric:        Attention and Perception: Attention and perception normal. She is attentive. She does not perceive auditory or visual hallucinations.        Mood and Affect: Mood and affect normal.        Speech: Speech normal.        Behavior: Behavior normal. Behavior is cooperative.        Thought Content: Thought content normal. Thought content is not paranoid or delusional. Thought content does not include homicidal or suicidal ideation. Thought content does not include homicidal or suicidal plan.        Judgment: Judgment normal.       No results found for any visits on 09/28/23.  Assessment & Plan     Problem List Items Addressed This Visit       Other   Primary insomnia - Primary   She reports chronic sleep disturbances, including difficulty staying asleep and never feeling rested, persisting since her twenties. Previous sleep study was normal. She has tried various sleep aids, including trazodone  and magnesium, with inconsistent results. Due to medication sensitivity and previous trazodone  experience, continue trazodone . Lunesta was considered but not prescribed due to potential grogginess. - Prescribe Trazodone  100 mg at bedtime as needed for sleep. - Consider a low dose of Lunesta if trazodone  is ineffective.      Relevant Medications   traZODone  (DESYREL ) 100 MG tablet   Anxiety   She presents with worsening high-functioning anxiety,  evidenced by a GAD-7 score increase from 0 to 11 over two months. Symptoms include difficulty staying asleep, irritability, and worry, potentially exacerbated by perimenopausal symptoms. She is not on anxiety medications currently but has used hydroxyzine  previously. Lexapro  is recommended for its dual benefits on anxiety and perimenopausal symptoms. Discussed potential side effects: sleep disturbances, heart palpitations, headaches, and gastrointestinal upset. She is concerned about sleep but open to medication. Emphasized the importance of therapy in conjunction with medication for mental health improvement. - Prescribe Lexapro  10 mg daily in the morning. - Prescribe Trazodone  100 mg at bedtime as needed for sleep. - Continue therapy sessions. - Monitor for Lexapro  side effects, including sleep disturbances and gastrointestinal upset.      Relevant Medications   escitalopram  (LEXAPRO ) 10 MG tablet   traZODone  (DESYREL ) 100 MG tablet   Assessment & Plan    Perimenopausal Symptoms She experiences perimenopausal symptoms, including emotional lability and irritability, without hot flashes. Previously tried hormone replacement therapy without benefit and discontinued due to lack of improvement and potential risks. Lexapro  may alleviate these symptoms. - Start Lexapro  10 mg daily, which may help with perimenopausal symptoms.    General Health Maintenance She has made lifestyle changes, including increased exercise and a diet focused on fruits and vegetables, reducing joint pain. Discussed the importance of monitoring cholesterol, A1c, and liver enzymes, previously elevated. - Check cholesterol, A1c, and liver enzymes at the next appointment.  Follow-up Follow-up is crucial to monitor the effectiveness of the new medication regimen and reassess overall health status. A follow-up appointment in one month is planned to assess the response to Lexapro  and trazodone . A comprehensive check-up in  August will evaluate cholesterol, A1c, and liver enzymes. The November appointment will be maintained for further follow-up if needed. - Schedule a follow-up appointment in one month to assess the response to Lexapro  and trazodone . - Plan for a comprehensive check-up in August to evaluate cholesterol, A1c, and liver enzymes. - Maintain the November appointment for  further follow-up if needed.     Return in about 1 month (around 10/29/2023) for Anxiety,Lipids and CMP.         Rockie Agent, MD  [name] [phone] (phone) [phone] (fax)  [facility] "

## 2023-09-29 DIAGNOSIS — F419 Anxiety disorder, unspecified: Secondary | ICD-10-CM | POA: Insufficient documentation

## 2023-09-29 NOTE — Assessment & Plan Note (Signed)
 She presents with worsening high-functioning anxiety, evidenced by a GAD-7 score increase from 0 to 11 over two months. Symptoms include difficulty staying asleep, irritability, and worry, potentially exacerbated by perimenopausal symptoms. She is not on anxiety medications currently but has used hydroxyzine  previously. Lexapro  is recommended for its dual benefits on anxiety and perimenopausal symptoms. Discussed potential side effects: sleep disturbances, heart palpitations, headaches, and gastrointestinal upset. She is concerned about sleep but open to medication. Emphasized the importance of therapy in conjunction with medication for mental health improvement. - Prescribe Lexapro  10 mg daily in the morning. - Prescribe Trazodone  100 mg at bedtime as needed for sleep. - Continue therapy sessions. - Monitor for Lexapro  side effects, including sleep disturbances and gastrointestinal upset.

## 2023-09-29 NOTE — Assessment & Plan Note (Signed)
 She reports chronic sleep disturbances, including difficulty staying asleep and never feeling rested, persisting since her twenties. Previous sleep study was normal. She has tried various sleep aids, including trazodone  and magnesium, with inconsistent results. Due to medication sensitivity and previous trazodone  experience, continue trazodone . Lunesta was considered but not prescribed due to potential grogginess. - Prescribe Trazodone  100 mg at bedtime as needed for sleep. - Consider a low dose of Lunesta if trazodone  is ineffective.

## 2023-10-08 DIAGNOSIS — G5793 Unspecified mononeuropathy of bilateral lower limbs: Secondary | ICD-10-CM | POA: Diagnosis not present

## 2023-10-08 DIAGNOSIS — M778 Other enthesopathies, not elsewhere classified: Secondary | ICD-10-CM | POA: Diagnosis not present

## 2023-11-26 ENCOUNTER — Other Ambulatory Visit: Payer: Self-pay

## 2023-11-26 ENCOUNTER — Encounter: Payer: Self-pay | Admitting: Family Medicine

## 2023-11-26 ENCOUNTER — Ambulatory Visit: Admitting: Family Medicine

## 2023-11-26 VITALS — BP 111/73 | HR 69 | Temp 98.9°F | Ht 64.0 in | Wt 166.3 lb

## 2023-11-26 DIAGNOSIS — F5101 Primary insomnia: Secondary | ICD-10-CM | POA: Diagnosis not present

## 2023-11-26 DIAGNOSIS — Z23 Encounter for immunization: Secondary | ICD-10-CM

## 2023-11-26 DIAGNOSIS — F419 Anxiety disorder, unspecified: Secondary | ICD-10-CM

## 2023-11-26 DIAGNOSIS — R6882 Decreased libido: Secondary | ICD-10-CM | POA: Diagnosis not present

## 2023-11-26 DIAGNOSIS — I10 Essential (primary) hypertension: Secondary | ICD-10-CM

## 2023-11-26 MED ORDER — BUPROPION HCL 75 MG PO TABS
75.0000 mg | ORAL_TABLET | Freq: Every evening | ORAL | 2 refills | Status: DC
Start: 2023-11-26 — End: 2024-01-21
  Filled 2023-11-26: qty 60, 60d supply, fill #0

## 2023-11-26 NOTE — Progress Notes (Signed)
 Established patient visit   Patient: Ashley [name]   DOB: [birth year]   53 y.o. Female  MRN: [id number] Visit Date: 11/26/2023  Today's healthcare provider: Rockie Agent, MD   Chief Complaint  Patient presents with   Medical Management of Chronic Issues   Anxiety    Patient is present for medication follow up, reports that she is taking lexapro  daily at night because it makes her feel sleepy. Patient does report that her sex drive is very low    Insomnia    Patient is using trazadone prn, seems to help with sleep.    Subjective     HPI     Anxiety    Additional comments: Patient is present for medication follow up, reports that she is taking lexapro  daily at night because it makes her feel sleepy. Patient does report that her sex drive is very low         Insomnia    Additional comments: Patient is using trazadone prn, seems to help with sleep.       Last edited by Cherry Chiquita HERO, CMA on 11/26/2023 10:31 AM.       Discussed the use of AI scribe software for clinical note transcription with the patient, who gave verbal consent to proceed.  History of Present Illness Ashley [name] is a 53 year old female with anxiety and insomnia who presents for follow-up.  She has been taking Lexapro  10 mg daily for anxiety, which has significantly reduced her emotional irritability and anxiety score from 11 to 2. However, she experiences significant daytime sleepiness, so she takes it at night. She also reports a decreased sex drive since starting Lexapro .  For her insomnia, she uses trazodone  100 mg at bedtime as needed, which she finds effective. She does not use it every night, only when she feels it is necessary.  She has a history of chronic hypertension, which is currently well controlled with amlodipine  5 mg daily and a combination of triamterene  37.5 mg and hydrochlorothiazide  25 mg daily. She is monitoring her blood pressure regularly.     Past Medical  History:  Diagnosis Date   Brain hemangioma (HCC)    Benig in 2018, never went to get head MRI   Hypertension    Migraine     Medications: Outpatient Medications Prior to Visit  Medication Sig   amLODipine  (NORVASC ) 5 MG tablet Take 1 tablet (5 mg total) by mouth daily.   Blood Pressure Monitoring (OMRON 3 SERIES BP MONITOR) DEVI Use as directed   Drospirenone  (SLYND ) 4 MG TABS Take 1 tablet (4 mg total) by mouth daily.   escitalopram  (LEXAPRO ) 10 MG tablet Take 1 tablet (10 mg total) by mouth daily.   traZODone  (DESYREL ) 100 MG tablet Take 1 tablet (100 mg total) by mouth at bedtime.   triamterene -hydrochlorothiazide  (MAXZIDE -25) 37.5-25 MG tablet Take 1 tablet by mouth daily.   [DISCONTINUED] albuterol  (VENTOLIN  HFA) 108 (90 Base) MCG/ACT inhaler USE PRIOR TO EXERCISE AS DIRECTED (Patient not taking: Reported on 09/28/2023)   [DISCONTINUED] azelastine  (ASTELIN ) 0.1 % nasal spray PLACE 1 SPRAY INTO BOTH NOSTRILS 2 TIMES DAILY. USE IN EACH NOSTRIL AS DIRECTED (Patient not taking: Reported on 09/28/2023)   [DISCONTINUED] estradiol  (ESTRACE ) 1 MG tablet Take 1 tablet (1 mg total) by mouth daily. (Patient not taking: Reported on 09/28/2023)   [DISCONTINUED] hydrOXYzine  (VISTARIL ) 50 MG capsule Take 1 capsule (50 mg total) by mouth at bedtime as needed. (Patient not taking: Reported on 09/28/2023)   [  DISCONTINUED] Insulin  Pen Needle (UNIFINE PENTIPS) 31G X 5 MM MISC Use as directed with Saxenda  (Patient not taking: Reported on 09/28/2023)   [DISCONTINUED] meloxicam  (MOBIC ) 7.5 MG tablet Take 1 tablet (7.5 mg total) by mouth 2 (two) times daily with a meal.   [DISCONTINUED] methylPREDNISolone  (MEDROL ) 4 MG TBPK tablet Take 1 dose pk by oral route. Follow instructions on package (Patient not taking: Reported on 09/22/2023)   [DISCONTINUED] norethindrone  (MICRONOR ) 0.35 MG tablet Take 1 tablet (0.35 mg total) by mouth daily.   [DISCONTINUED] phentermine  (ADIPEX-P ) 37.5 MG tablet Take 1 tablet (37.5 mg  total) by mouth daily before breakfast. (Patient not taking: Reported on 09/15/2019)   [DISCONTINUED] trimethoprim -polymyxin b  (POLYTRIM ) ophthalmic solution Place 1 drop into the left eye every 4 (four) hours. (Patient not taking: Reported on 09/28/2023)   No facility-administered medications prior to visit.      11/26/2023   10:36 AM 09/28/2023   10:59 AM 07/23/2023    9:16 AM 02/07/2021   10:48 AM  GAD 7 : Generalized Anxiety Score  Nervous, Anxious, on Edge 0 1 0 0  Control/stop worrying 1 2 0 0  Worry too much - different things 1 1 0 0  Trouble relaxing 0 2 0 0  Restless 0 1 0 0  Easily annoyed or irritable 0 3 0 1  Afraid - awful might happen 0 1 0 0  Total GAD 7 Score 2 11 0 1  Anxiety Difficulty  Not difficult at all  Not difficult at all    [facility] Office Visit from 11/26/2023 in [facility]  PHQ-9 Total Score 2     Review of Systems      Objective    BP 111/73 (BP Location: Left Arm, Patient Position: Sitting, Cuff Size: Normal)   Pulse 69   Temp 98.9 F (37.2 C) (Oral)   Ht 5' 4 (1.626 m)   Wt 166 lb 4.8 oz (75.4 kg)   LMP 10/02/2023 (Approximate)   SpO2 100%   BMI 28.55 kg/m  BP Readings from Last 3 Encounters:  11/26/23 111/73  09/28/23 116/77  09/22/23 106/70   Wt Readings from Last 3 Encounters:  11/26/23 166 lb 4.8 oz (75.4 kg)  09/28/23 169 lb (76.7 kg)  09/22/23 170 lb 8 oz (77.3 kg)        Physical Exam Vitals reviewed.  Constitutional:      General: She is not in acute distress.    Appearance: Normal appearance. She is not ill-appearing.  Cardiovascular:     Rate and Rhythm: Normal rate and regular rhythm.  Pulmonary:     Effort: Pulmonary effort is normal. No respiratory distress.     Breath sounds: No wheezing, rhonchi or rales.  Neurological:     Mental Status: She is alert and oriented to person, place, and time.  Psychiatric:        Mood and Affect: Mood normal.        Behavior: Behavior  normal.     Physical Exam VITALS: P- 60, BP- 111/73, SaO2- 100%    No results found for any visits on 11/26/23.  Assessment & Plan     Problem List Items Addressed This Visit     Anxiety - Primary   Chronic condition associated with insomnia and SSRI-induced decreased libido Anxiety disorder is well-managed with Lexapro , reducing anxiety score from 11 to 2. Lexapro  causes daytime sleepiness, so it is taken at night. Insomnia is managed with trazodone  as needed.  Lexapro  also causes decreased libido. - Continue Lexapro  10 mg at night. - Prescribe Wellbutrin  75 mg once daily at bedtime to address decreased libido, monitoring for increased anxiety. - Continue trazodone  100 mg as needed for sleep. - Discussed potential side effects of Wellbutrin , including possible increased anxiety.      Relevant Medications   buPROPion  (WELLBUTRIN ) 75 MG tablet   Essential hypertension    Chronic hypertension Chronic hypertension is well-controlled with current medication regimen. She is interested in tapering off blood pressure medications. - Continue amlodipine  5 mg daily. - Reduce triamterene /hydrochlorothiazide  37.5/25 mg to half for one month, then discontinue if blood pressure remains controlled. - Monitor blood pressure regularly. - Plan to reduce amlodipine  dose after successful discontinuation of triamterene /hydrochlorothiazide .      Primary insomnia   Chronic condition  Continue Trazodone  100mg  at bedtime       Other Visit Diagnoses       Immunization due       Relevant Orders   Flu vaccine trivalent PF, 6mos and older(Flulaval,Afluria,Fluarix,Fluzone) (Completed)     Decreased sex drive       Relevant Medications   buPROPion  (WELLBUTRIN ) 75 MG tablet        Assessment & Plan      HM  Influenza vaccine was administered today  Patient tolerated well     Return in about 8 weeks (around 01/21/2024) for HTN, med tapering, A1C & lipids .         Rockie Agent, MD  [facility] [phone]) [phone] (fax)  [facility]

## 2023-11-26 NOTE — Patient Instructions (Signed)
 To keep you healthy, please keep in mind the following health maintenance items that you are due for:   Health Maintenance Due  Topic Date Due   Pneumococcal Vaccine: 50+ Years (1 of 2 - PCV) Never done   Hepatitis B Vaccines 19-59 Average Risk (1 of 3 - 19+ 3-dose series) Never done   Zoster Vaccines- Shingrix (1 of 2) Never done   Influenza Vaccine  10/08/2023     Best Wishes,   Dr. Lang

## 2023-11-28 NOTE — Assessment & Plan Note (Signed)
  Chronic hypertension Chronic hypertension is well-controlled with current medication regimen. She is interested in tapering off blood pressure medications. - Continue amlodipine  5 mg daily. - Reduce triamterene /hydrochlorothiazide  37.5/25 mg to half for one month, then discontinue if blood pressure remains controlled. - Monitor blood pressure regularly. - Plan to reduce amlodipine  dose after successful discontinuation of triamterene /hydrochlorothiazide .

## 2023-11-28 NOTE — Assessment & Plan Note (Signed)
 Chronic condition associated with insomnia and SSRI-induced decreased libido Anxiety disorder is well-managed with Lexapro , reducing anxiety score from 11 to 2. Lexapro  causes daytime sleepiness, so it is taken at night. Insomnia is managed with trazodone  as needed. Lexapro  also causes decreased libido. - Continue Lexapro  10 mg at night. - Prescribe Wellbutrin  75 mg once daily at bedtime to address decreased libido, monitoring for increased anxiety. - Continue trazodone  100 mg as needed for sleep. - Discussed potential side effects of Wellbutrin , including possible increased anxiety.

## 2023-11-28 NOTE — Assessment & Plan Note (Signed)
 Chronic condition  Continue Trazodone  100mg  at bedtime

## 2023-12-09 ENCOUNTER — Encounter: Payer: Self-pay | Admitting: Family Medicine

## 2023-12-21 ENCOUNTER — Other Ambulatory Visit: Payer: Self-pay

## 2023-12-24 DIAGNOSIS — R202 Paresthesia of skin: Secondary | ICD-10-CM | POA: Diagnosis not present

## 2023-12-24 DIAGNOSIS — E538 Deficiency of other specified B group vitamins: Secondary | ICD-10-CM | POA: Diagnosis not present

## 2023-12-24 DIAGNOSIS — E519 Thiamine deficiency, unspecified: Secondary | ICD-10-CM | POA: Diagnosis not present

## 2023-12-24 DIAGNOSIS — R200 Anesthesia of skin: Secondary | ICD-10-CM | POA: Diagnosis not present

## 2023-12-24 DIAGNOSIS — M79672 Pain in left foot: Secondary | ICD-10-CM | POA: Diagnosis not present

## 2023-12-24 DIAGNOSIS — M79671 Pain in right foot: Secondary | ICD-10-CM | POA: Diagnosis not present

## 2024-01-19 DIAGNOSIS — R200 Anesthesia of skin: Secondary | ICD-10-CM | POA: Diagnosis not present

## 2024-01-19 DIAGNOSIS — R202 Paresthesia of skin: Secondary | ICD-10-CM | POA: Diagnosis not present

## 2024-01-21 ENCOUNTER — Ambulatory Visit: Admitting: Family Medicine

## 2024-01-21 ENCOUNTER — Other Ambulatory Visit: Payer: Self-pay

## 2024-01-21 ENCOUNTER — Encounter: Payer: Self-pay | Admitting: Family Medicine

## 2024-01-21 VITALS — BP 118/82 | HR 77 | Temp 98.4°F | Ht 64.0 in | Wt 165.8 lb

## 2024-01-21 DIAGNOSIS — I10 Essential (primary) hypertension: Secondary | ICD-10-CM

## 2024-01-21 DIAGNOSIS — F5101 Primary insomnia: Secondary | ICD-10-CM

## 2024-01-21 DIAGNOSIS — F519 Sleep disorder not due to a substance or known physiological condition, unspecified: Secondary | ICD-10-CM

## 2024-01-21 DIAGNOSIS — R6882 Decreased libido: Secondary | ICD-10-CM | POA: Diagnosis not present

## 2024-01-21 MED ORDER — TRAZODONE HCL 100 MG PO TABS
200.0000 mg | ORAL_TABLET | Freq: Every day | ORAL | 1 refills | Status: AC
Start: 2024-01-21 — End: ?
  Filled 2024-01-21: qty 180, 90d supply, fill #0

## 2024-01-21 MED ORDER — BUPROPION HCL ER (XL) 150 MG PO TB24
150.0000 mg | ORAL_TABLET | Freq: Every day | ORAL | 1 refills | Status: DC
  Filled 2024-01-21: qty 90, 90d supply, fill #0

## 2024-01-21 NOTE — Assessment & Plan Note (Signed)
 Hypertension Chronic  Well-controlled with amlodipine  5 mg daily. Triamterene  has been tapered off, and blood pressure remains stable. - Continue amlodipine  5 mg daily - Discontinued triamterene 

## 2024-01-21 NOTE — Assessment & Plan Note (Signed)
 Chronic  Continue trazodone  at increased dose of 200mg  nightly

## 2024-01-21 NOTE — Patient Instructions (Signed)
 To keep you healthy, please keep in mind the following health maintenance items that you are due for:   There are no preventive care reminders to display for this patient.   Best Wishes,   Dr. Lang

## 2024-01-21 NOTE — Progress Notes (Signed)
 Established patient visit   Patient: Ashley [name]   DOB: 08-26-1970   53 y.o. Female  MRN: [id number] Visit Date: 01/21/2024  Today's healthcare provider: Rockie Agent, MD   Chief Complaint  Patient presents with   Medical Management of Chronic Issues    Patient presents for chronic f/u, feeling well overall    Anxiety    Patient does note improvement on anxiety but does state libido has not changed much.    Subjective     HPI     Medical Management of Chronic Issues    Additional comments: Patient presents for chronic f/u, feeling well overall         Anxiety    Additional comments: Patient does note improvement on anxiety but does state libido has not changed much.       Last edited by Cherry Chiquita HERO, CMA on 01/21/2024  9:39 AM.       Discussed the use of AI scribe software for clinical note transcription with the patient, who gave verbal consent to proceed.  History of Present Illness Ashley [name] is a 53 year old female who presents for follow-up on her chronic hypertension and anxiety.  She feels well overall with some improvement in anxiety, although her libido has not improved much despite being on Wellbutrin  75 mg daily. She is also taking Lexapro  for anxiety, which she believes is affecting her libido.  She was previously on triamterene  but has tapered off and is currently only taking amlodipine  5 mg daily. Her blood pressure readings have been good, and she continues to monitor it at home.  For contraception, she is on Slynd  4 mg daily. For sleep, she takes trazodone  100 mg at bedtime but reports inconsistent sleep patterns, waking up at times during the night. She does not take trazodone  every night.  She has a history of prediabetes with an A1c in the prediabetes range. Her cholesterol and vitamin D  levels were checked in May, with vitamin D  at 33. She has not had any recent fasting blood work done.     Past Medical History:   Diagnosis Date   Brain hemangioma (HCC)    Benig in 2018, never went to get head MRI   Hypertension    Migraine     Medications: Outpatient Medications Prior to Visit  Medication Sig   amLODipine  (NORVASC ) 5 MG tablet Take 1 tablet (5 mg total) by mouth daily.   Blood Pressure Monitoring (OMRON 3 SERIES BP MONITOR) DEVI Use as directed   Drospirenone  (SLYND ) 4 MG TABS Take 1 tablet (4 mg total) by mouth daily.   escitalopram  (LEXAPRO ) 10 MG tablet Take 1 tablet (10 mg total) by mouth daily.   [DISCONTINUED] buPROPion  (WELLBUTRIN ) 75 MG tablet Take 1 tablet (75 mg total) by mouth at bedtime.   [DISCONTINUED] traZODone  (DESYREL ) 100 MG tablet Take 1 tablet (100 mg total) by mouth at bedtime.   [DISCONTINUED] triamterene -hydrochlorothiazide  (MAXZIDE -25) 37.5-25 MG tablet Take 1 tablet by mouth daily.   No facility-administered medications prior to visit.    Review of Systems  Last CBC Lab Results  Component Value Date   WBC 8.4 07/23/2023   HGB 15.9 07/23/2023   HCT 47.2 (H) 07/23/2023   MCV 88 07/23/2023   MCH 29.7 07/23/2023   RDW 12.6 07/23/2023   PLT 348 07/23/2023   Last metabolic panel Lab Results  Component Value Date   GLUCOSE 87 07/23/2023   NA 137 07/23/2023   K 4.0  07/23/2023   CL 99 07/23/2023   CO2 21 07/23/2023   BUN 15 07/23/2023   CREATININE 1.01 (H) 07/23/2023   EGFR 66 07/23/2023   CALCIUM 9.3 07/23/2023   PROT 7.3 07/23/2023   ALBUMIN 4.2 07/23/2023   LABGLOB 3.1 07/23/2023   AGRATIO 1.3 06/19/2022   BILITOT 0.7 07/23/2023   ALKPHOS 135 (H) 07/23/2023   AST 19 07/23/2023   ALT 25 07/23/2023   ANIONGAP 7 07/20/2014   Last lipids Lab Results  Component Value Date   CHOL 249 (H) 07/23/2023   HDL 65 07/23/2023   LDLCALC 166 (H) 07/23/2023   TRIG 101 07/23/2023   CHOLHDL 3.8 07/23/2023  The 10-year ASCVD risk score (Arnett DK, et al., 2019) is: 3.9%  Last hemoglobin A1c Lab Results  Component Value Date   HGBA1C 5.8 (H) 07/23/2023    Last thyroid  functions Lab Results  Component Value Date   TSH 1.160 05/26/2023   Last vitamin D  Lab Results  Component Value Date   VD25OH 33.0 07/23/2023   Last vitamin B12 and Folate Lab Results  Component Value Date   VITAMINB12 597 05/24/2020   FOLATE 11.3 05/24/2020        Objective    BP 118/82 (BP Location: Left Arm, Patient Position: Sitting, Cuff Size: Large)   Pulse 77   Temp 98.4 F (36.9 C) (Oral)   Ht 5' 4 (1.626 m)   Wt 165 lb 12.8 oz (75.2 kg)   SpO2 99%   BMI 28.46 kg/m   BP Readings from Last 3 Encounters:  01/21/24 118/82  11/26/23 111/73  09/28/23 116/77   Wt Readings from Last 3 Encounters:  01/21/24 165 lb 12.8 oz (75.2 kg)  11/26/23 166 lb 4.8 oz (75.4 kg)  09/28/23 169 lb (76.7 kg)        Physical Exam Vitals reviewed.  Constitutional:      General: She is not in acute distress.    Appearance: Normal appearance. She is normal weight. She is not ill-appearing, toxic-appearing or diaphoretic.     Comments: Well groomed, calmly sitting   Cardiovascular:     Rate and Rhythm: Normal rate and regular rhythm.  Pulmonary:     Effort: Pulmonary effort is normal. No respiratory distress.     Breath sounds: No wheezing, rhonchi or rales.  Neurological:     Mental Status: She is alert and oriented to person, place, and time.  Psychiatric:        Attention and Perception: Attention and perception normal. She is attentive. She does not perceive auditory or visual hallucinations.        Mood and Affect: Mood and affect normal.        Speech: Speech normal.        Behavior: Behavior normal. Behavior is cooperative.        Thought Content: Thought content normal. Thought content is not paranoid or delusional. Thought content does not include homicidal or suicidal ideation. Thought content does not include homicidal or suicidal plan.        Judgment: Judgment normal.       No results found for any visits on 01/21/24.  Assessment & Plan      Problem List Items Addressed This Visit     Essential hypertension - Primary   Hypertension Chronic  Well-controlled with amlodipine  5 mg daily. Triamterene  has been tapered off, and blood pressure remains stable. - Continue amlodipine  5 mg daily - Discontinued triamterene       Non-organic  sleep disorder   Chronic  Continue trazodone  at increased dose of 200mg  nightly       Primary insomnia   Insomnia Chronic  Managed with trazodone  100 mg at bedtime, but sleep quality is inconsistent. Increasing trazodone  to 200 mg may improve sleep. Alternative medications like Lunesta and newer options were discussed, but insurance coverage is a concern. - Increased trazodone  to 200 mg at bedtime - Refilled trazodone  prescription       Relevant Medications   traZODone  (DESYREL ) 100 MG tablet   Other Visit Diagnoses       Decreased sex drive       Relevant Medications   buPROPion  (WELLBUTRIN  XL) 150 MG 24 hr tablet       Assessment and Plan Assessment & Plan        Return in about 10 weeks (around 03/31/2024) for Insomnia, Anxiety, HTN.         Rockie Agent, MD  [facility] [phone] (phone) [phone] (fax)  [facility]

## 2024-01-21 NOTE — Assessment & Plan Note (Signed)
 Insomnia Chronic  Managed with trazodone  100 mg at bedtime, but sleep quality is inconsistent. Increasing trazodone  to 200 mg may improve sleep. Alternative medications like Lunesta and newer options were discussed, but insurance coverage is a concern. - Increased trazodone  to 200 mg at bedtime - Refilled trazodone  prescription

## 2024-01-28 DIAGNOSIS — M79672 Pain in left foot: Secondary | ICD-10-CM | POA: Diagnosis not present

## 2024-01-28 DIAGNOSIS — M79671 Pain in right foot: Secondary | ICD-10-CM | POA: Diagnosis not present

## 2024-02-18 DIAGNOSIS — M79671 Pain in right foot: Secondary | ICD-10-CM | POA: Diagnosis not present

## 2024-02-18 DIAGNOSIS — M79672 Pain in left foot: Secondary | ICD-10-CM | POA: Diagnosis not present

## 2024-03-20 ENCOUNTER — Other Ambulatory Visit (HOSPITAL_COMMUNITY): Payer: Self-pay

## 2024-03-20 ENCOUNTER — Other Ambulatory Visit: Payer: Self-pay | Admitting: Family Medicine

## 2024-03-20 ENCOUNTER — Other Ambulatory Visit: Payer: Self-pay

## 2024-03-20 DIAGNOSIS — F419 Anxiety disorder, unspecified: Secondary | ICD-10-CM

## 2024-03-20 MED ORDER — ESCITALOPRAM OXALATE 10 MG PO TABS
10.0000 mg | ORAL_TABLET | Freq: Every day | ORAL | 1 refills | Status: DC
  Filled 2024-03-20 (×2): qty 90, 90d supply, fill #0

## 2024-03-24 ENCOUNTER — Other Ambulatory Visit: Payer: Self-pay

## 2024-03-24 ENCOUNTER — Other Ambulatory Visit (HOSPITAL_COMMUNITY): Payer: Self-pay

## 2024-03-31 ENCOUNTER — Ambulatory Visit: Admitting: Family Medicine

## 2024-04-06 ENCOUNTER — Encounter: Payer: Self-pay | Admitting: Family Medicine

## 2024-04-06 ENCOUNTER — Ambulatory Visit: Admitting: Family Medicine

## 2024-04-06 VITALS — BP 125/86 | HR 90 | Ht 64.0 in | Wt 165.2 lb

## 2024-04-06 DIAGNOSIS — Z1211 Encounter for screening for malignant neoplasm of colon: Secondary | ICD-10-CM

## 2024-04-06 DIAGNOSIS — R7303 Prediabetes: Secondary | ICD-10-CM

## 2024-04-06 DIAGNOSIS — I10 Essential (primary) hypertension: Secondary | ICD-10-CM | POA: Diagnosis not present

## 2024-04-06 DIAGNOSIS — F419 Anxiety disorder, unspecified: Secondary | ICD-10-CM

## 2024-04-06 DIAGNOSIS — E7800 Pure hypercholesterolemia, unspecified: Secondary | ICD-10-CM

## 2024-04-06 DIAGNOSIS — F5101 Primary insomnia: Secondary | ICD-10-CM

## 2024-04-06 MED ORDER — BUSPIRONE HCL 7.5 MG PO TABS: 7.5000 mg | ORAL_TABLET | Freq: Two times a day (BID) | ORAL | 3 refills | Status: AC

## 2024-04-06 MED ORDER — ESCITALOPRAM OXALATE 10 MG PO TABS: ORAL_TABLET | ORAL | Status: AC

## 2024-04-06 NOTE — Patient Instructions (Addendum)
" °  VISIT SUMMARY: During your visit, we discussed your concerns about your current medications, particularly Wellbutrin  and Lexapro . We have made some changes to your medication regimen to better address your anxiety and other symptoms.  YOUR PLAN: ANXIETY: You have been experiencing a lack of sex drive and feeling emotionally flat with Lexapro . Wellbutrin  was not effective in improving your sex drive. -Discontinue Wellbutrin . -Taper Lexapro : reduce to 5 mg daily for 2 weeks, then 5 mg every other day for 2 weeks, then stop. -Start Buspar  7.5 mg, take once or twice daily as needed for anxiety.  PRIMARY INSOMNIA: You occasionally use trazodone  to help with sleep and find it effective. -Continue using trazodone  as needed for sleep.  ESSENTIAL HYPERTENSION: Your blood pressure is well-controlled with your current medication. -Continue your current antihypertensive regimen.  GENERAL HEALTH MAINTENANCE: You need to complete a colonoscopy and have a physical exam scheduled. -Referral for colonoscopy has been sent. -Your physical exam is scheduled for May 22nd. -Your virtual follow-up is scheduled for March 20th.    Contains text generated by [employer].     To keep you healthy, please keep in mind the following health maintenance items that you are due for:   Health Maintenance Due  Topic Date Due   COVID-19 Vaccine (5 - 2025-26 season) 11/08/2023   Colonoscopy  07/04/2024     Best Wishes,   Dr. Lang  "

## 2024-04-11 ENCOUNTER — Other Ambulatory Visit: Payer: Self-pay

## 2024-04-11 ENCOUNTER — Telehealth: Payer: Self-pay

## 2024-04-11 DIAGNOSIS — Z800 Family history of malignant neoplasm of digestive organs: Secondary | ICD-10-CM

## 2024-04-11 DIAGNOSIS — Z860100 Personal history of colon polyps, unspecified: Secondary | ICD-10-CM

## 2024-04-11 MED ORDER — NA SULFATE-K SULFATE-MG SULF 17.5-3.13-1.6 GM/177ML PO SOLN
354.0000 mL | Freq: Once | ORAL | 0 refills | Status: AC
  Filled 2024-04-11: qty 354, 1d supply, fill #0

## 2024-04-11 NOTE — Telephone Encounter (Signed)
 Gastroenterology Pre-Procedure Review  Request Date: 05/26/2024 Requesting Physician: Dr. Theophilus  PATIENT REVIEW QUESTIONS: The patient responded to the following health history questions as indicated:    1. Are you having any GI issues? no 2. Do you have a personal history of Polyps? yes (07/04/2021 Dr. Unk polyps) 3. Do you have a family history of Colon Cancer or Polyps? yes (Maternal GM-colon CA, Maternal sister-colon CA) 4. Diabetes Mellitus? no 5. Joint replacements in the past 12 months?no 6. Major health problems in the past 3 months?no 7. Any artificial heart valves, MVP, or defibrillator?no    MEDICATIONS & ALLERGIES:    Patient reports the following regarding taking any anticoagulation/antiplatelet therapy:   Plavix, Coumadin, Eliquis, Xarelto, Lovenox, Pradaxa, Brilinta, or Effient? no Aspirin? no  Patient confirms/reports the following medications:  Current Outpatient Medications  Medication Sig Dispense Refill   amLODipine  (NORVASC ) 5 MG tablet Take 1 tablet (5 mg total) by mouth daily. 90 tablet 3   Blood Pressure Monitoring (OMRON 3 SERIES BP MONITOR) DEVI Use as directed 1 each 0   busPIRone  (BUSPAR ) 7.5 MG tablet Take 1 tablet (7.5 mg total) by mouth 2 (two) times daily. 60 tablet 3   Drospirenone  (SLYND ) 4 MG TABS Take 1 tablet (4 mg total) by mouth daily. 84 tablet 3   escitalopram  (LEXAPRO ) 10 MG tablet Take 0.5 tablets (5 mg total) by mouth daily for 14 days, THEN 0.5 tablets (5 mg total) every other day for 14 days.     traZODone  (DESYREL ) 100 MG tablet Take 2 tablets (200 mg total) by mouth at bedtime. 180 tablet 1   No current facility-administered medications for this visit.    Patient confirms/reports the following allergies:  Allergies[1]  No orders of the defined types were placed in this encounter.   AUTHORIZATION INFORMATION Primary Insurance: 1D#: Group #:  Secondary Insurance: 1D#: Group #:  SCHEDULE INFORMATION: Date:  05/26/2024 Time: Location: [facility] Dr. Theophilus     [1]  Allergies Allergen Reactions   Ciprofloxacin Hives   Codeine Nausea And Vomiting   Gabapentin Nausea And Vomiting   Hydrocodone-Acetaminophen Nausea And Vomiting   Hydromorphone Hcl Nausea And Vomiting   Oxycodone-Acetaminophen Nausea And Vomiting   Promethazine Hcl Nausea And Vomiting   Tramadol Nausea And Vomiting   Sulfa  Antibiotics Rash    hives hives

## 2024-04-28 ENCOUNTER — Ambulatory Visit: Admitting: Family Medicine

## 2024-05-26 ENCOUNTER — Ambulatory Visit: Admit: 2024-05-26

## 2024-08-18 ENCOUNTER — Encounter: Admitting: Family Medicine
